# Patient Record
Sex: Female | Born: 1950 | Race: White | Hispanic: No | Smoking: Current every day smoker
Health system: Southern US, Community
[De-identification: ages and names within clinical notes are randomized; demographics above are authoritative.]

## PROBLEM LIST (undated history)

## (undated) DIAGNOSIS — C50919 Malignant neoplasm of unspecified site of unspecified female breast: Secondary | ICD-10-CM

## (undated) DIAGNOSIS — Z923 Personal history of irradiation: Secondary | ICD-10-CM

## (undated) DIAGNOSIS — I10 Essential (primary) hypertension: Secondary | ICD-10-CM

## (undated) DIAGNOSIS — M1990 Unspecified osteoarthritis, unspecified site: Secondary | ICD-10-CM

## (undated) DIAGNOSIS — H269 Unspecified cataract: Secondary | ICD-10-CM

## (undated) HISTORY — DX: Unspecified cataract: H26.9

## (undated) HISTORY — PX: COLONOSCOPY: SHX174

## (undated) HISTORY — PX: WISDOM TOOTH EXTRACTION: SHX21

## (undated) HISTORY — PX: APPENDECTOMY: SHX54

## (undated) HISTORY — PX: TONSILLECTOMY: SUR1361

---

## 2014-05-16 HISTORY — PX: BREAST LUMPECTOMY: SHX2

## 2018-02-06 ENCOUNTER — Other Ambulatory Visit: Payer: Self-pay | Admitting: Internal Medicine

## 2018-02-06 DIAGNOSIS — R922 Inconclusive mammogram: Secondary | ICD-10-CM

## 2018-02-14 ENCOUNTER — Ambulatory Visit: Payer: Self-pay

## 2018-02-14 ENCOUNTER — Ambulatory Visit
Admission: RE | Admit: 2018-02-14 | Discharge: 2018-02-14 | Disposition: A | Discharge status: Home / Self Care | Payer: Medicare Other | Source: Ambulatory Visit | Attending: Internal Medicine | Admitting: Internal Medicine

## 2018-02-14 DIAGNOSIS — R922 Inconclusive mammogram: Secondary | ICD-10-CM

## 2018-02-14 HISTORY — DX: Personal history of irradiation: Z92.3

## 2019-02-06 ENCOUNTER — Other Ambulatory Visit: Payer: Self-pay | Admitting: Internal Medicine

## 2019-02-06 DIAGNOSIS — Z9889 Other specified postprocedural states: Secondary | ICD-10-CM

## 2019-02-20 ENCOUNTER — Ambulatory Visit
Admission: RE | Admit: 2019-02-20 | Discharge: 2019-02-20 | Disposition: A | Discharge status: Home / Self Care | Payer: Medicare Other | Source: Ambulatory Visit | Attending: Internal Medicine | Admitting: Internal Medicine

## 2019-02-20 ENCOUNTER — Other Ambulatory Visit: Payer: Self-pay

## 2019-02-20 DIAGNOSIS — Z9889 Other specified postprocedural states: Secondary | ICD-10-CM

## 2020-01-14 ENCOUNTER — Other Ambulatory Visit: Payer: Self-pay | Admitting: Internal Medicine

## 2020-01-16 ENCOUNTER — Other Ambulatory Visit: Payer: Self-pay | Admitting: Internal Medicine

## 2020-01-16 DIAGNOSIS — R5381 Other malaise: Secondary | ICD-10-CM

## 2020-02-03 ENCOUNTER — Other Ambulatory Visit: Payer: Self-pay | Admitting: Internal Medicine

## 2020-02-07 ENCOUNTER — Other Ambulatory Visit: Payer: Self-pay | Admitting: Internal Medicine

## 2020-02-07 DIAGNOSIS — M8580 Other specified disorders of bone density and structure, unspecified site: Secondary | ICD-10-CM

## 2020-02-07 DIAGNOSIS — Z9889 Other specified postprocedural states: Secondary | ICD-10-CM

## 2020-02-24 NOTE — Progress Notes (Addendum)
COVID Vaccine Completed:  x2 Date COVID Vaccine completed: 06-22-19 & 07-13-19  COVID vaccine manufacturer: [place]   PCP - Merton Border, MD Cardiologist -   Chest x-ray -  EKG - 02-25-20 in Epic Stress Test -  ECHO -  Cardiac Cath -  Pacemaker/ICD device last checked:  Sleep Study -  CPAP -   Fasting Blood Sugar -  Checks Blood Sugar _____ times a day  Blood Thinner Instructions: Aspirin Instructions: Last Dose:  Anesthesia review:   Patient denies shortness of breath, fever, cough and chest pain at PAT appointment   Patient verbalized understanding of instructions that were given to them at the PAT appointment. Patient was also instructed that they will need to review over the PAT instructions again at home before surgery.

## 2020-02-24 NOTE — Patient Instructions (Addendum)
DUE TO COVID-19 ONLY ONE VISITOR IS ALLOWED TO COME WITH YOU AND STAY IN THE WAITING ROOM ONLY  DURING PRE OP AND PROCEDURE.   IF YOU WILL BE ADMITTED INTO THE HOSPITAL YOU ARE ALLOWED ONE SUPPORT PERSON DURING  VISITATION HOURS ONLY (10AM -8PM)   . The support person may change daily. . The support person must pass our screening, gel in and out, and wear a mask at all times, including in the patient's room. . Patients must also wear a mask when staff or their support person are in the room.   COVID SWAB TESTING MUST BE COMPLETED ON:  Friday, 02-28-20 @ 2:45   [address] [place], [place] [address]  (Must self quarantine after testing. Follow instructions on  handout.)        Your procedure is scheduled on:  Tuesday, 03-03-20   Report to [facility] Main  Entrance   Report to admitting at 10:30 AM   Call this number if you have problems the morning of surgery [phone]   Do not eat food :After Midnight.   May have liquids until 9:55 AM day of surgery  CLEAR LIQUID DIET  Foods Allowed                                                                     Foods Excluded  Water, Black Coffee and tea, regular and decaf             liquids that you cannot  Plain Jell-O in any flavor  (No red)                                   see through such as: Fruit ices (not with fruit pulp)                                      milk, soups, orange juice              Iced Popsicles (No red)                                      All solid food                                   Apple juices Sports drinks like Gatorade (No red) Lightly seasoned clear broth or consume(fat free) Sugar, honey syrup    Complete one Ensure drink the morning of surgery at  9:55 AM  the day of surgery.   Oral Hygiene is also important to reduce your risk of infection.                                    Remember - BRUSH YOUR TEETH THE MORNING OF SURGERY WITH YOUR REGULAR TOOTHPASTE   Do NOT smoke after  Midnight   Take these medicines the morning of  surgery with A SIP OF WATER:  None                                You may not have any metal on your body including hair pins, jewelry, and body piercings             Do not wear make-up, lotions, powders, perfumes/cologne, or deodorant             Do not wear nail polish.  Do not shave  48 hours prior to surgery.    Do not bring valuables to the hospital. [facility].   Contacts, dentures or bridgework may not be worn into surgery.     Patients discharged the day of surgery will not be allowed to drive home.                Please read over the following fact sheets you were given: IF YOU HAVE QUESTIONS ABOUT YOUR PRE OP INSTRUCTIONS PLEASE CALL [phone]   [place] - Preparing for Surgery Before surgery, you can play an important role.  Because skin is not sterile, your skin needs to be as free of germs as possible.  You can reduce the number of germs on your skin by washing with CHG (chlorahexidine gluconate) soap before surgery.  CHG is an antiseptic cleaner which kills germs and bonds with the skin to continue killing germs even after washing. Please DO NOT use if you have an allergy to CHG or antibacterial soaps.  If your skin becomes reddened/irritated stop using the CHG and inform your nurse when you arrive at Short Stay. Do not shave (including legs and underarms) for at least 48 hours prior to the first CHG shower.  You may shave your face/neck.  Please follow these instructions carefully:  1.  Shower with CHG Soap the night before surgery and the  morning of surgery.  2.  If you choose to wash your hair, wash your hair first as usual with your normal  shampoo.  3.  After you shampoo, rinse your hair and body thoroughly to remove the shampoo.                             4.  Use CHG as you would any other liquid soap.  You can apply chg directly to the skin and wash.  Gently with a scrungie  or clean washcloth.  5.  Apply the CHG Soap to your body ONLY FROM THE NECK DOWN.   Do   not use on face/ open                           Wound or open sores. Avoid contact with eyes, ears mouth and   genitals (private parts).                       Wash face,  Genitals (private parts) with your normal soap.             6.  Wash thoroughly, paying special attention to the area where your    surgery  will be performed.  7.  Thoroughly rinse your body with warm water from the neck down.  8.  DO NOT shower/wash with your normal soap after using and rinsing  off the CHG Soap.                9.  Pat yourself dry with a clean towel.            10.  Wear clean pajamas.            11.  Place clean sheets on your bed the night of your first shower and do not  sleep with pets. Day of Surgery : Do not apply any lotions/deodorants the morning of surgery.  Please wear clean clothes to the hospital/surgery center.  FAILURE TO FOLLOW THESE INSTRUCTIONS MAY RESULT IN THE CANCELLATION OF YOUR SURGERY  PATIENT SIGNATURE_________________________________  NURSE SIGNATURE__________________________________  ________________________________________________________________________   Charlotte Blackwell  An incentive spirometer is a tool that can help keep your lungs clear and active. This tool measures how well you are filling your lungs with each breath. Taking long deep breaths may help reverse or decrease the chance of developing breathing (pulmonary) problems (especially infection) following:  A long period of time when you are unable to move or be active. BEFORE THE PROCEDURE   If the spirometer includes an indicator to show your best effort, your nurse or respiratory therapist will set it to a desired goal.  If possible, sit up straight or lean slightly forward. Try not to slouch.  Hold the incentive spirometer in an upright position. INSTRUCTIONS FOR USE  1. Sit on the edge of your bed if possible,  or sit up as far as you can in bed or on a chair. 2. Hold the incentive spirometer in an upright position. 3. Breathe out normally. 4. Place the mouthpiece in your mouth and seal your lips tightly around it. 5. Breathe in slowly and as deeply as possible, raising the piston or the ball toward the top of the column. 6. Hold your breath for 3-5 seconds or for as long as possible. Allow the piston or ball to fall to the bottom of the column. 7. Remove the mouthpiece from your mouth and breathe out normally. 8. Rest for a few seconds and repeat Steps 1 through 7 at least 10 times every 1-2 hours when you are awake. Take your time and take a few normal breaths between deep breaths. 9. The spirometer may include an indicator to show your best effort. Use the indicator as a goal to work toward during each repetition. 10. After each set of 10 deep breaths, practice coughing to be sure your lungs are clear. If you have an incision (the cut made at the time of surgery), support your incision when coughing by placing a pillow or rolled up towels firmly against it. Once you are able to get out of bed, walk around indoors and cough well. You may stop using the incentive spirometer when instructed by your caregiver.  RISKS AND COMPLICATIONS  Take your time so you do not get dizzy or light-headed.  If you are in pain, you may need to take or ask for pain medication before doing incentive spirometry. It is harder to take a deep breath if you are having pain. AFTER USE  Rest and breathe slowly and easily.  It can be helpful to keep track of a log of your progress. Your caregiver can provide you with a simple table to help with this. If you are using the spirometer at home, follow these instructions: [place] IF:   You are having difficultly using the spirometer.  You have trouble using the spirometer  as often as instructed.  Your pain medication is not giving enough relief while using the  spirometer.  You develop fever of 100.5 F (38.1 C) or higher. SEEK IMMEDIATE MEDICAL CARE IF:   You cough up bloody sputum that had not been present before.  You develop fever of 102 F (38.9 C) or greater.  You develop worsening pain at or near the incision site. MAKE SURE YOU:   Understand these instructions.  Will watch your condition.  Will get help right away if you are not doing well or get worse. Document Released: 09/12/2006 Document Revised: 07/25/2011 Document Reviewed: 11/13/2006 Exit[employer] Patient Information 2014 [employer], [place].   ________________________________________________________________________  WHAT IS A BLOOD TRANSFUSION? Blood Transfusion Information  A transfusion is the replacement of blood or some of its parts. Blood is made up of multiple cells which provide different functions.  Red blood cells carry oxygen and are used for blood loss replacement.  White blood cells fight against infection.  Platelets control bleeding.  Plasma helps clot blood.  Other blood products are available for specialized needs, such as hemophilia or other clotting disorders. BEFORE THE TRANSFUSION  Who gives blood for transfusions?   Healthy volunteers who are fully evaluated to make sure their blood is safe. This is blood bank blood. Transfusion therapy is the safest it has ever been in the practice of medicine. Before blood is taken from a donor, a complete history is taken to make sure that person has no history of diseases nor engages in risky social behavior (examples are intravenous drug use or sexual activity with multiple partners). The donor's travel history is screened to minimize risk of transmitting infections, such as malaria. The donated blood is tested for signs of infectious diseases, such as HIV and hepatitis. The blood is then tested to be sure it is compatible with you in order to minimize the chance of a transfusion reaction. If you or a relative  donates blood, this is often done in anticipation of surgery and is not appropriate for emergency situations. It takes many days to process the donated blood. RISKS AND COMPLICATIONS Although transfusion therapy is very safe and saves many lives, the main dangers of transfusion include:   Getting an infectious disease.  Developing a transfusion reaction. This is an allergic reaction to something in the blood you were given. Every precaution is taken to prevent this. The decision to have a blood transfusion has been considered carefully by your caregiver before blood is given. Blood is not given unless the benefits outweigh the risks. AFTER THE TRANSFUSION  Right after receiving a blood transfusion, you will usually feel much better and more energetic. This is especially true if your red blood cells have gotten low (anemic). The transfusion raises the level of the red blood cells which carry oxygen, and this usually causes an energy increase.  The nurse administering the transfusion will monitor you carefully for complications. HOME CARE INSTRUCTIONS  No special instructions are needed after a transfusion. You may find your energy is better. Speak with your caregiver about any limitations on activity for underlying diseases you may have. SEEK MEDICAL CARE IF:   Your condition is not improving after your transfusion.  You develop redness or irritation at the intravenous (IV) site. SEEK IMMEDIATE MEDICAL CARE IF:  Any of the following symptoms occur over the next 12 hours:  Shaking chills.  You have a temperature by mouth above 102 F (38.9 C), not controlled by medicine.  Chest, back,  or muscle pain.  People around you feel you are not acting correctly or are confused.  Shortness of breath or difficulty breathing.  Dizziness and fainting.  You get a rash or develop hives.  You have a decrease in urine output.  Your urine turns a dark color or changes to pink, red, or brown. Any  of the following symptoms occur over the next 10 days:  You have a temperature by mouth above 102 F (38.9 C), not controlled by medicine.  Shortness of breath.  Weakness after normal activity.  The white part of the eye turns yellow (jaundice).  You have a decrease in the amount of urine or are urinating less often.  Your urine turns a dark color or changes to pink, red, or brown. Document Released: 04/29/2000 Document Revised: 07/25/2011 Document Reviewed: 12/17/2007 [facility] Patient Information 2014 [place], [place].  _______________________________________________________________________

## 2020-02-25 ENCOUNTER — Encounter (HOSPITAL_COMMUNITY)
Admission: RE | Admit: 2020-02-25 | Discharge: 2020-02-25 | Disposition: A | Discharge status: Home / Self Care | Payer: Medicare Other | Source: Ambulatory Visit | Attending: Orthopedic Surgery | Admitting: Orthopedic Surgery

## 2020-02-25 ENCOUNTER — Other Ambulatory Visit: Payer: Self-pay

## 2020-02-25 ENCOUNTER — Encounter (HOSPITAL_COMMUNITY): Payer: Self-pay

## 2020-02-25 DIAGNOSIS — Z01818 Encounter for other preprocedural examination: Secondary | ICD-10-CM | POA: Diagnosis present

## 2020-02-25 DIAGNOSIS — I10 Essential (primary) hypertension: Secondary | ICD-10-CM | POA: Diagnosis not present

## 2020-02-25 HISTORY — DX: Unspecified osteoarthritis, unspecified site: M19.90

## 2020-02-25 HISTORY — DX: Essential (primary) hypertension: I10

## 2020-02-25 HISTORY — DX: Malignant neoplasm of unspecified site of unspecified female breast: C50.919

## 2020-02-25 LAB — CBC
HCT: 42.2 % (ref 36.0–46.0)
Hemoglobin: 14.3 g/dL (ref 12.0–15.0)
MCH: 31.3 pg (ref 26.0–34.0)
MCHC: 33.9 g/dL (ref 30.0–36.0)
MCV: 92.3 fL (ref 80.0–100.0)
Platelets: 249 10*3/uL (ref 150–400)
RBC: 4.57 MIL/uL (ref 3.87–5.11)
RDW: 12.7 % (ref 11.5–15.5)
WBC: 9.4 10*3/uL (ref 4.0–10.5)
nRBC: 0 % (ref 0.0–0.2)

## 2020-02-25 LAB — BASIC METABOLIC PANEL
Anion gap: 10 (ref 5–15)
BUN: 10 mg/dL (ref 8–23)
CO2: 25 mmol/L (ref 22–32)
Calcium: 9.7 mg/dL (ref 8.9–10.3)
Chloride: 102 mmol/L (ref 98–111)
Creatinine, Ser: 0.51 mg/dL (ref 0.44–1.00)
GFR, Estimated: >60 mL/min (ref >60)
Glucose, Bld: 95 mg/dL (ref 70–99)
Potassium: 4 mmol/L (ref 3.5–5.1)
Sodium: 137 mmol/L (ref 135–145)

## 2020-02-25 LAB — SURGICAL PCR SCREEN
MRSA, PCR: NEGATIVE
Staphylococcus aureus: NEGATIVE

## 2020-02-26 ENCOUNTER — Ambulatory Visit
Admission: RE | Admit: 2020-02-26 | Discharge: 2020-02-26 | Disposition: A | Discharge status: Home / Self Care | Payer: Medicare Other | Source: Ambulatory Visit | Attending: Internal Medicine | Admitting: Internal Medicine

## 2020-02-26 DIAGNOSIS — Z9889 Other specified postprocedural states: Secondary | ICD-10-CM

## 2020-02-28 ENCOUNTER — Other Ambulatory Visit (HOSPITAL_COMMUNITY)
Admission: RE | Admit: 2020-02-28 | Discharge: 2020-02-28 | Disposition: A | Discharge status: Home / Self Care | Payer: Medicare Other | Source: Ambulatory Visit | Attending: Orthopedic Surgery | Admitting: Orthopedic Surgery

## 2020-02-28 DIAGNOSIS — Z20822 Contact with and (suspected) exposure to covid-19: Secondary | ICD-10-CM | POA: Insufficient documentation

## 2020-02-28 DIAGNOSIS — Z01812 Encounter for preprocedural laboratory examination: Secondary | ICD-10-CM | POA: Insufficient documentation

## 2020-02-29 LAB — SARS CORONAVIRUS 2 (TAT 6-24 HRS): SARS Coronavirus 2: NEGATIVE

## 2020-03-01 NOTE — H&P (Signed)
TOTAL HIP ADMISSION H&P  Patient is admitted for right total hip arthroplasty, anterior approach.  Subjective:  Chief Complaint: Right hip OA / pain  HPI: Jinan Biggins Wangerin, 69 y.o. female, has a history of pain and functional disability in the right hip(s) due to arthritis and patient has failed non-surgical conservative treatments for greater than 12 weeks to include NSAID's and/or analgesics, use of assistive devices and activity modification.  Onset of symptoms was gradual starting 10 years ago with gradually worsening course since that time.The patient noted no past surgery on the right hip(s).  Patient currently rates pain in the right hip at 10 out of 10 with activity. Patient has night pain, worsening of pain with activity and weight bearing, trendelenberg gait, pain that interfers with activities of daily living and pain with passive range of motion. Patient has evidence of periarticular osteophytes and joint space narrowing by imaging studies. This condition presents safety issues increasing the risk of falls.There is no current active infection.   Risks, benefits and expectations were discussed with the patient.  Risks including but not limited to the risk of anesthesia, blood clots, nerve damage, blood vessel damage, failure of the prosthesis, infection and up to and including death.  Patient understand the risks, benefits and expectations and wishes to proceed with surgery.   PCP: Wayland Denis, MD  D/C Plans:       Home (SD)  Post-op Meds:       No Rx given / Rx given for ASA, Robaxin, Norco, Iron, Colace and MiraLax  Tranexamic Acid:      To be given - IV   Decadron:      Is to be given  FYI:      ASA  Norco  DME:   Pt already has equipment   PT:   [facility]  Pharmacy: [facility]    Past Medical History:  Diagnosis Date  . Arthritis   . Breast cancer ([place])    Left breast  . Hypertension   . Personal history of radiation therapy     Past Surgical  History:  Procedure Laterality Date  . APPENDECTOMY    . BREAST LUMPECTOMY Left 2016  . COLONOSCOPY    . TONSILLECTOMY    . WISDOM TOOTH EXTRACTION      No current facility-administered medications for this encounter.   Current Outpatient Medications  Medication Sig Dispense Refill Last Dose  . acetaminophen (TYLENOL) 650 MG CR tablet Take 1,300 mg by mouth in the morning and at bedtime.     Marland Kitchen lisinopril (ZESTRIL) 20 MG tablet Take 20 mg by mouth at bedtime.      . Multiple Vitamins-Minerals (MULTIVITAMIN WITH MINERALS) tablet Take 1 tablet by mouth daily.     . traMADol (ULTRAM) 50 MG tablet Take 50 mg by mouth every 6 (six) hours as needed for moderate pain.      Marland Kitchen zolpidem (AMBIEN) 10 MG tablet Take 10 mg by mouth at bedtime.      No Known Allergies   Social History   Tobacco Use  . Smoking status: Current Every Day Smoker    Packs/day: 1.00  . Smokeless tobacco: Never Used  . Tobacco comment: Starting smoking at age 53  Substance Use Topics  . Alcohol use: Yes    Comment: very rare       Review of Systems  Constitutional: Negative.   HENT: Negative.   Eyes: Negative.   Respiratory: Negative.   Cardiovascular: Negative.  Gastrointestinal: Negative.   Genitourinary: Negative.   Musculoskeletal: Positive for joint pain.  Skin: Negative.   Neurological: Negative.   Endo/Heme/Allergies: Negative.   Psychiatric/Behavioral: The patient has insomnia.       Objective:  Physical Exam Constitutional:      Appearance: She is well-developed.  HENT:     Head: Normocephalic.  Eyes:     Pupils: Pupils are equal, round, and reactive to light.  Neck:     Thyroid: No thyromegaly.     Vascular: No JVD.     Trachea: No tracheal deviation.  Cardiovascular:     Rate and Rhythm: Normal rate and regular rhythm.  Pulmonary:     Effort: Pulmonary effort is normal. No respiratory distress.     Breath sounds: Normal breath sounds. No wheezing.  Abdominal:     Palpations:  Abdomen is soft.     Tenderness: There is no abdominal tenderness. There is no guarding.  Musculoskeletal:     Cervical back: Neck supple.     Right hip: Tenderness and bony tenderness present. No deformity. Decreased range of motion. Decreased strength.  Lymphadenopathy:     Cervical: No cervical adenopathy.  Skin:    General: Skin is warm and dry.  Neurological:     Mental Status: She is alert and oriented to person, place, and time.       Labs:  Estimated body mass index is 21.07 kg/m as calculated from the following:   Height as of 02/25/20: 5\' 6"  (1.676 m).   Weight as of 02/25/20: 59.2 kg.   Imaging Review Plain radiographs demonstrate severe degenerative joint disease of the right hip(s). The bone quality appears to be good for age and reported activity level.      Assessment/Plan:  End stage arthritis, right hip  The patient history, physical examination, clinical judgement of the provider and imaging studies are consistent with end stage degenerative joint disease of the right hip(s) and total hip arthroplasty is deemed medically necessary. The treatment options including medical management, injection therapy, arthroscopy and arthroplasty were discussed at length. The risks and benefits of total hip arthroplasty were presented and reviewed. The risks due to aseptic loosening, infection, stiffness, dislocation/subluxation,  thromboembolic complications and other imponderables were discussed.  The patient acknowledged the explanation, agreed to proceed with the plan and consent was signed. Patient is being admitted for  treatment for surgery, pain control, PT, OT, prophylactic antibiotics, VTE prophylaxis, progressive ambulation and ADL's and discharge planning.The patient is planning to be discharged home.    [place] Pugh Milayah Krell   PA-C  03/01/2020, 3:06 PM

## 2020-03-03 ENCOUNTER — Encounter (HOSPITAL_COMMUNITY): Payer: Self-pay | Admitting: Orthopedic Surgery

## 2020-03-03 ENCOUNTER — Ambulatory Visit (HOSPITAL_COMMUNITY): Payer: Medicare Other

## 2020-03-03 ENCOUNTER — Other Ambulatory Visit: Payer: Self-pay

## 2020-03-03 ENCOUNTER — Ambulatory Visit (HOSPITAL_COMMUNITY): Payer: Medicare Other | Admitting: Certified Registered"

## 2020-03-03 ENCOUNTER — Encounter (HOSPITAL_COMMUNITY)
Admission: EM | Disposition: A | Discharge status: Home / Self Care | Payer: Self-pay | Source: Home / Self Care | Attending: Orthopedic Surgery

## 2020-03-03 ENCOUNTER — Observation Stay (HOSPITAL_COMMUNITY)
Admission: EM | Admit: 2020-03-03 | Discharge: 2020-03-04 | Disposition: A | Discharge status: Home / Self Care | Payer: Medicare Other | Attending: Orthopedic Surgery | Admitting: Orthopedic Surgery

## 2020-03-03 DIAGNOSIS — Z79899 Other long term (current) drug therapy: Secondary | ICD-10-CM | POA: Insufficient documentation

## 2020-03-03 DIAGNOSIS — F17210 Nicotine dependence, cigarettes, uncomplicated: Secondary | ICD-10-CM | POA: Diagnosis not present

## 2020-03-03 DIAGNOSIS — Z853 Personal history of malignant neoplasm of breast: Secondary | ICD-10-CM | POA: Insufficient documentation

## 2020-03-03 DIAGNOSIS — M1611 Unilateral primary osteoarthritis, right hip: Secondary | ICD-10-CM | POA: Diagnosis not present

## 2020-03-03 DIAGNOSIS — M25551 Pain in right hip: Secondary | ICD-10-CM | POA: Diagnosis present

## 2020-03-03 DIAGNOSIS — Z96649 Presence of unspecified artificial hip joint: Secondary | ICD-10-CM

## 2020-03-03 DIAGNOSIS — I10 Essential (primary) hypertension: Secondary | ICD-10-CM | POA: Insufficient documentation

## 2020-03-03 DIAGNOSIS — Z96641 Presence of right artificial hip joint: Secondary | ICD-10-CM

## 2020-03-03 DIAGNOSIS — Z419 Encounter for procedure for purposes other than remedying health state, unspecified: Secondary | ICD-10-CM

## 2020-03-03 HISTORY — PX: TOTAL HIP ARTHROPLASTY: SHX124

## 2020-03-03 LAB — TYPE AND SCREEN
ABO/RH(D): O POS
Antibody Screen: NEGATIVE

## 2020-03-03 LAB — ABO/RH: ABO/RH(D): O POS

## 2020-03-03 SURGERY — ARTHROPLASTY, HIP, TOTAL, ANTERIOR APPROACH
Anesthesia: Spinal | Site: Hip | Laterality: Right

## 2020-03-03 MED ORDER — ONDANSETRON HCL 4 MG PO TABS: 4.0000 mg | ORAL_TABLET | Freq: Four times a day (QID) | ORAL | Status: DC | PRN

## 2020-03-03 MED ORDER — METHOCARBAMOL 500 MG PO TABS: 500.0000 mg | ORAL_TABLET | Freq: Four times a day (QID) | ORAL | 0 refills | Status: DC | PRN

## 2020-03-03 MED ORDER — METOCLOPRAMIDE HCL 5 MG/ML IJ SOLN: 5.0000 mg | Freq: Three times a day (TID) | INTRAMUSCULAR | Status: DC | PRN

## 2020-03-03 MED ORDER — DEXAMETHASONE SODIUM PHOSPHATE 10 MG/ML IJ SOLN
10.0000 mg | Freq: Once | INTRAMUSCULAR | Status: DC
  Filled 2020-03-03: qty 1

## 2020-03-03 MED ORDER — CEFAZOLIN SODIUM-DEXTROSE 1-4 GM/50ML-% IV SOLN
1.0000 g | Freq: Four times a day (QID) | INTRAVENOUS | Status: AC
  Administered 2020-03-03 – 2020-03-04 (×2): 1 g via INTRAVENOUS
  Filled 2020-03-03: qty 50

## 2020-03-03 MED ORDER — PHENOL 1.4 % MT LIQD: 1.0000 | OROMUCOSAL | Status: DC | PRN

## 2020-03-03 MED ORDER — HYDROCODONE-ACETAMINOPHEN 7.5-325 MG PO TABS
1.0000 | ORAL_TABLET | ORAL | 0 refills | Status: DC | PRN
Start: 2020-03-03 — End: 2020-09-21

## 2020-03-03 MED ORDER — CEFAZOLIN SODIUM-DEXTROSE 1-4 GM/50ML-% IV SOLN
INTRAVENOUS | Status: AC
  Filled 2020-03-03: qty 50

## 2020-03-03 MED ORDER — POLYETHYLENE GLYCOL 3350 17 G PO PACK: 17.0000 g | PACK | Freq: Two times a day (BID) | ORAL | 0 refills | Status: DC

## 2020-03-03 MED ORDER — MENTHOL 3 MG MT LOZG: 1.0000 | LOZENGE | OROMUCOSAL | Status: DC | PRN

## 2020-03-03 MED ORDER — PROPOFOL 500 MG/50ML IV EMUL
INTRAVENOUS | Status: DC | PRN
  Administered 2020-03-03: 25 ug/kg/min via INTRAVENOUS

## 2020-03-03 MED ORDER — DEXAMETHASONE SODIUM PHOSPHATE 10 MG/ML IJ SOLN
10.0000 mg | Freq: Once | INTRAMUSCULAR | Status: AC
  Administered 2020-03-03: 10 mg via INTRAVENOUS

## 2020-03-03 MED ORDER — MIDAZOLAM HCL 5 MG/5ML IJ SOLN
INTRAMUSCULAR | Status: DC | PRN
  Administered 2020-03-03 (×2): 1 mg via INTRAVENOUS

## 2020-03-03 MED ORDER — ACETAMINOPHEN 325 MG PO TABS: 325.0000 mg | ORAL_TABLET | Freq: Four times a day (QID) | ORAL | Status: DC | PRN

## 2020-03-03 MED ORDER — OXYCODONE HCL 5 MG PO TABS: 5.0000 mg | ORAL_TABLET | Freq: Once | ORAL | Status: DC | PRN

## 2020-03-03 MED ORDER — MAGNESIUM CITRATE PO SOLN: 1.0000 | Freq: Once | ORAL | Status: DC | PRN

## 2020-03-03 MED ORDER — EPHEDRINE 5 MG/ML INJ
INTRAVENOUS | Status: AC
  Filled 2020-03-03: qty 10

## 2020-03-03 MED ORDER — METOCLOPRAMIDE HCL 5 MG PO TABS: 5.0000 mg | ORAL_TABLET | Freq: Three times a day (TID) | ORAL | Status: DC | PRN

## 2020-03-03 MED ORDER — TRANEXAMIC ACID-NACL 1000-0.7 MG/100ML-% IV SOLN
1000.0000 mg | Freq: Once | INTRAVENOUS | Status: AC
  Administered 2020-03-03: 1000 mg via INTRAVENOUS

## 2020-03-03 MED ORDER — LISINOPRIL 20 MG PO TABS
20.0000 mg | ORAL_TABLET | Freq: Every day | ORAL | Status: DC
  Administered 2020-03-03: 20 mg via ORAL
  Filled 2020-03-03: qty 1

## 2020-03-03 MED ORDER — HYDROCODONE-ACETAMINOPHEN 7.5-325 MG PO TABS
ORAL_TABLET | ORAL | Status: AC
Start: 2020-03-03 — End: 2020-03-04
  Filled 2020-03-03: qty 1

## 2020-03-03 MED ORDER — HYDROCODONE-ACETAMINOPHEN 5-325 MG PO TABS
1.0000 | ORAL_TABLET | ORAL | Status: DC | PRN
  Administered 2020-03-04: 2 via ORAL
  Filled 2020-03-03: qty 2

## 2020-03-03 MED ORDER — HYDROCODONE-ACETAMINOPHEN 7.5-325 MG PO TABS
1.0000 | ORAL_TABLET | ORAL | Status: DC | PRN
  Administered 2020-03-03: 1 via ORAL
  Filled 2020-03-03: qty 1

## 2020-03-03 MED ORDER — ASPIRIN 81 MG PO CHEW: 81.0000 mg | CHEWABLE_TABLET | Freq: Two times a day (BID) | ORAL | 0 refills | Status: AC

## 2020-03-03 MED ORDER — ONDANSETRON HCL 4 MG/2ML IJ SOLN
INTRAMUSCULAR | Status: AC
  Filled 2020-03-03: qty 2

## 2020-03-03 MED ORDER — FERROUS SULFATE 325 (65 FE) MG PO TABS: 325.0000 mg | ORAL_TABLET | Freq: Three times a day (TID) | ORAL | 0 refills | Status: DC

## 2020-03-03 MED ORDER — SODIUM CHLORIDE 0.9 % IV SOLN: INTRAVENOUS | Status: DC

## 2020-03-03 MED ORDER — AMISULPRIDE (ANTIEMETIC) 5 MG/2ML IV SOLN: 10.0000 mg | Freq: Once | INTRAVENOUS | Status: DC | PRN

## 2020-03-03 MED ORDER — CEFAZOLIN SODIUM-DEXTROSE 2-4 GM/100ML-% IV SOLN
2.0000 g | INTRAVENOUS | Status: AC
  Administered 2020-03-03: 2 g via INTRAVENOUS
  Filled 2020-03-03: qty 100

## 2020-03-03 MED ORDER — DOCUSATE SODIUM 100 MG PO CAPS
100.0000 mg | ORAL_CAPSULE | Freq: Two times a day (BID) | ORAL | Status: DC
  Administered 2020-03-03 – 2020-03-04 (×2): 100 mg via ORAL
  Filled 2020-03-03 (×2): qty 1

## 2020-03-03 MED ORDER — PHENYLEPHRINE 40 MCG/ML (10ML) SYRINGE FOR IV PUSH (FOR BLOOD PRESSURE SUPPORT)
PREFILLED_SYRINGE | INTRAVENOUS | Status: DC | PRN
  Administered 2020-03-03 (×2): 80 ug via INTRAVENOUS
  Administered 2020-03-03: 120 ug via INTRAVENOUS

## 2020-03-03 MED ORDER — DIPHENHYDRAMINE HCL 12.5 MG/5ML PO ELIX: 12.5000 mg | ORAL_SOLUTION | ORAL | Status: DC | PRN

## 2020-03-03 MED ORDER — BISACODYL 10 MG RE SUPP: 10.0000 mg | Freq: Every day | RECTAL | Status: DC | PRN

## 2020-03-03 MED ORDER — LACTATED RINGERS IV BOLUS: 250.0000 mL | Freq: Once | INTRAVENOUS | Status: DC

## 2020-03-03 MED ORDER — ONDANSETRON HCL 4 MG/2ML IJ SOLN: 4.0000 mg | Freq: Once | INTRAMUSCULAR | Status: DC | PRN

## 2020-03-03 MED ORDER — ORAL CARE MOUTH RINSE: 15.0000 mL | Freq: Once | OROMUCOSAL | Status: AC

## 2020-03-03 MED ORDER — DOCUSATE SODIUM 100 MG PO CAPS: 100.0000 mg | ORAL_CAPSULE | Freq: Two times a day (BID) | ORAL | 0 refills | Status: DC

## 2020-03-03 MED ORDER — LIDOCAINE 2% (20 MG/ML) 5 ML SYRINGE
INTRAMUSCULAR | Status: DC | PRN
  Administered 2020-03-03: 50 mg via INTRAVENOUS

## 2020-03-03 MED ORDER — FENTANYL CITRATE (PF) 100 MCG/2ML IJ SOLN
INTRAMUSCULAR | Status: DC | PRN
  Administered 2020-03-03 (×2): 25 ug via INTRAVENOUS

## 2020-03-03 MED ORDER — PHENYLEPHRINE 40 MCG/ML (10ML) SYRINGE FOR IV PUSH (FOR BLOOD PRESSURE SUPPORT)
PREFILLED_SYRINGE | INTRAVENOUS | Status: AC
  Filled 2020-03-03: qty 10

## 2020-03-03 MED ORDER — STERILE WATER FOR IRRIGATION IR SOLN
Status: DC | PRN
Start: 2020-03-03 — End: 2020-03-03
  Administered 2020-03-03: 2000 mL

## 2020-03-03 MED ORDER — GLYCOPYRROLATE PF 0.2 MG/ML IJ SOSY
PREFILLED_SYRINGE | INTRAMUSCULAR | Status: DC | PRN
  Administered 2020-03-03: .2 mg via INTRAVENOUS

## 2020-03-03 MED ORDER — POLYETHYLENE GLYCOL 3350 17 G PO PACK
17.0000 g | PACK | Freq: Two times a day (BID) | ORAL | Status: DC
  Administered 2020-03-03: 17 g via ORAL
  Filled 2020-03-03 (×2): qty 1

## 2020-03-03 MED ORDER — CELECOXIB 200 MG PO CAPS
200.0000 mg | ORAL_CAPSULE | Freq: Two times a day (BID) | ORAL | Status: DC
  Administered 2020-03-03 – 2020-03-04 (×2): 200 mg via ORAL
  Filled 2020-03-03 (×2): qty 1

## 2020-03-03 MED ORDER — LIDOCAINE 2% (20 MG/ML) 5 ML SYRINGE
INTRAMUSCULAR | Status: AC
  Filled 2020-03-03: qty 5

## 2020-03-03 MED ORDER — TRANEXAMIC ACID-NACL 1000-0.7 MG/100ML-% IV SOLN
INTRAVENOUS | Status: AC
  Filled 2020-03-03: qty 100

## 2020-03-03 MED ORDER — TRANEXAMIC ACID-NACL 1000-0.7 MG/100ML-% IV SOLN
1000.0000 mg | INTRAVENOUS | Status: AC
  Administered 2020-03-03: 1000 mg via INTRAVENOUS
  Filled 2020-03-03: qty 100

## 2020-03-03 MED ORDER — PROPOFOL 10 MG/ML IV BOLUS
INTRAVENOUS | Status: DC | PRN
  Administered 2020-03-03: 20 mg via INTRAVENOUS
  Administered 2020-03-03: 30 mg via INTRAVENOUS

## 2020-03-03 MED ORDER — LACTATED RINGERS IV SOLN: INTRAVENOUS | Status: DC

## 2020-03-03 MED ORDER — FENTANYL CITRATE (PF) 100 MCG/2ML IJ SOLN: 25.0000 ug | INTRAMUSCULAR | Status: DC | PRN

## 2020-03-03 MED ORDER — ZOLPIDEM TARTRATE 5 MG PO TABS
5.0000 mg | ORAL_TABLET | Freq: Every day | ORAL | Status: DC
  Administered 2020-03-03: 5 mg via ORAL
  Filled 2020-03-03: qty 1

## 2020-03-03 MED ORDER — ASPIRIN 81 MG PO CHEW
81.0000 mg | CHEWABLE_TABLET | Freq: Two times a day (BID) | ORAL | Status: DC
  Administered 2020-03-04: 81 mg via ORAL
  Filled 2020-03-03: qty 1

## 2020-03-03 MED ORDER — OXYCODONE HCL 5 MG/5ML PO SOLN: 5.0000 mg | Freq: Once | ORAL | Status: DC | PRN

## 2020-03-03 MED ORDER — FENTANYL CITRATE (PF) 100 MCG/2ML IJ SOLN
INTRAMUSCULAR | Status: AC
  Filled 2020-03-03: qty 2

## 2020-03-03 MED ORDER — BUPIVACAINE IN DEXTROSE 0.75-8.25 % IT SOLN
INTRATHECAL | Status: DC | PRN
  Administered 2020-03-03: 1.6 mL via INTRATHECAL

## 2020-03-03 MED ORDER — SODIUM CHLORIDE 0.9 % IR SOLN
Status: DC | PRN
  Administered 2020-03-03: 1000 mL

## 2020-03-03 MED ORDER — ALUM & MAG HYDROXIDE-SIMETH 200-200-20 MG/5ML PO SUSP: 15.0000 mL | ORAL | Status: DC | PRN

## 2020-03-03 MED ORDER — PHENYLEPHRINE HCL-NACL 10-0.9 MG/250ML-% IV SOLN
INTRAVENOUS | Status: DC | PRN
  Administered 2020-03-03: 25 ug/min via INTRAVENOUS

## 2020-03-03 MED ORDER — HYDROMORPHONE HCL 1 MG/ML IJ SOLN: 0.5000 mg | INTRAMUSCULAR | Status: DC | PRN

## 2020-03-03 MED ORDER — DEXAMETHASONE SODIUM PHOSPHATE 10 MG/ML IJ SOLN
INTRAMUSCULAR | Status: AC
  Filled 2020-03-03: qty 1

## 2020-03-03 MED ORDER — FERROUS SULFATE 325 (65 FE) MG PO TABS
325.0000 mg | ORAL_TABLET | Freq: Three times a day (TID) | ORAL | Status: DC
  Administered 2020-03-04: 325 mg via ORAL
  Filled 2020-03-03: qty 1

## 2020-03-03 MED ORDER — CHLORHEXIDINE GLUCONATE 0.12 % MT SOLN
15.0000 mL | Freq: Once | OROMUCOSAL | Status: AC
  Administered 2020-03-03: 15 mL via OROMUCOSAL

## 2020-03-03 MED ORDER — METHOCARBAMOL 500 MG IVPB - SIMPLE MED
500.0000 mg | Freq: Four times a day (QID) | INTRAVENOUS | Status: DC | PRN
  Filled 2020-03-03: qty 50

## 2020-03-03 MED ORDER — ONDANSETRON HCL 4 MG/2ML IJ SOLN: 4.0000 mg | Freq: Four times a day (QID) | INTRAMUSCULAR | Status: DC | PRN

## 2020-03-03 MED ORDER — METHOCARBAMOL 500 MG PO TABS
500.0000 mg | ORAL_TABLET | Freq: Four times a day (QID) | ORAL | Status: DC | PRN
  Administered 2020-03-04 (×2): 500 mg via ORAL
  Filled 2020-03-03 (×2): qty 1

## 2020-03-03 MED ORDER — MIDAZOLAM HCL 2 MG/2ML IJ SOLN: 1.0000 mg | INTRAMUSCULAR | Status: DC

## 2020-03-03 MED ORDER — LACTATED RINGERS IV BOLUS
500.0000 mL | Freq: Once | INTRAVENOUS | Status: AC
  Administered 2020-03-03: 500 mL via INTRAVENOUS

## 2020-03-03 SURGICAL SUPPLY — 47 items
BAG DECANTER FOR FLEXI CONT (MISCELLANEOUS) IMPLANT
BAG ZIPLOCK 12X15 (MISCELLANEOUS) IMPLANT
BLADE SAG 18X100X1.27 (BLADE) ×3 IMPLANT
BLADE SURG SZ10 CARB STEEL (BLADE) ×6 IMPLANT
COVER PERINEAL POST (MISCELLANEOUS) ×3 IMPLANT
COVER SURGICAL LIGHT HANDLE (MISCELLANEOUS) ×3 IMPLANT
COVER WAND RF STERILE (DRAPES) IMPLANT
CUP ACET PINNACLE SECTR 50MM (Hips) ×1 IMPLANT
DERMABOND ADVANCED (GAUZE/BANDAGES/DRESSINGS) ×2
DERMABOND ADVANCED .7 DNX12 (GAUZE/BANDAGES/DRESSINGS) ×1 IMPLANT
DRAPE STERI IOBAN 125X83 (DRAPES) ×3 IMPLANT
DRAPE U-SHAPE 47X51 STRL (DRAPES) ×6 IMPLANT
DRESSING AQUACEL AG SP 3.5X10 (GAUZE/BANDAGES/DRESSINGS) ×1 IMPLANT
DRSG AQUACEL AG SP 3.5X10 (GAUZE/BANDAGES/DRESSINGS) ×3
DURAPREP 26ML APPLICATOR (WOUND CARE) ×3 IMPLANT
ELECT REM PT RETURN 15FT ADLT (MISCELLANEOUS) ×3 IMPLANT
ELIMINATOR HOLE APEX DEPUY (Hips) ×3 IMPLANT
GLOVE BIO SURGEON STRL SZ 6 (GLOVE) ×6 IMPLANT
GLOVE BIOGEL PI IND STRL 6.5 (GLOVE) ×1 IMPLANT
GLOVE BIOGEL PI IND STRL 7.5 (GLOVE) ×1 IMPLANT
GLOVE BIOGEL PI IND STRL 8.5 (GLOVE) ×1 IMPLANT
GLOVE BIOGEL PI INDICATOR 6.5 (GLOVE) ×2
GLOVE BIOGEL PI INDICATOR 7.5 (GLOVE) ×2
GLOVE BIOGEL PI INDICATOR 8.5 (GLOVE) ×2
GLOVE ECLIPSE 8.0 STRL XLNG CF (GLOVE) ×6 IMPLANT
GLOVE ORTHO TXT STRL SZ7.5 (GLOVE) ×6 IMPLANT
GOWN STRL REUS W/TWL LRG LVL3 (GOWN DISPOSABLE) ×6 IMPLANT
GOWN STRL REUS W/TWL XL LVL3 (GOWN DISPOSABLE) ×3 IMPLANT
HEAD FEMORAL 32 CERAMIC (Hips) ×3 IMPLANT
HOLDER FOLEY CATH W/STRAP (MISCELLANEOUS) ×3 IMPLANT
KIT TURNOVER KIT A (KITS) ×3 IMPLANT
LINER ACET PNNCL PLUS4 NEUTRAL (Hips) ×1 IMPLANT
PACK ANTERIOR HIP CUSTOM (KITS) ×3 IMPLANT
PENCIL SMOKE EVACUATOR (MISCELLANEOUS) ×3 IMPLANT
PINNACLE PLUS 4 NEUTRAL (Hips) ×3 IMPLANT
PINNACLE SECTOR CUP 50MM (Hips) ×3 IMPLANT
SCREW 6.5MMX30MM (Screw) ×3 IMPLANT
STEM FEM ACTIS HIGH SZ3 (Stem) ×3 IMPLANT
SUT MNCRL AB 4-0 PS2 18 (SUTURE) ×3 IMPLANT
SUT STRATAFIX 0 PDS 27 VIOLET (SUTURE) ×3
SUT VIC AB 1 CT1 36 (SUTURE) ×9 IMPLANT
SUT VIC AB 2-0 CT1 27 (SUTURE) ×4
SUT VIC AB 2-0 CT1 TAPERPNT 27 (SUTURE) ×2 IMPLANT
SUTURE STRATFX 0 PDS 27 VIOLET (SUTURE) ×1 IMPLANT
TRAY FOLEY MTR SLVR 14FR STAT (SET/KITS/TRAYS/PACK) ×3 IMPLANT
TRAY FOLEY MTR SLVR 16FR STAT (SET/KITS/TRAYS/PACK) IMPLANT
WATER STERILE IRR 1000ML POUR (IV SOLUTION) ×6 IMPLANT

## 2020-03-03 NOTE — Evaluation (Signed)
Physical Therapy Evaluation Patient Details Name: Charlotte Blackwell MRN: [id number] DOB: January 07, 1951 Today's Date: 03/03/2020   History of Present Illness  Patient is 69 y.o. female s/p Rt THA anterior approach on 03/03/20 with PMH significant for HTN, OA, Breast Cancer.  Clinical Impression  Charlotte Blackwell is a 69 y.o. female POD 0 s/p Rt THA. Patient reports independence with mobility at baseline. Patient is now limited by functional impairments (see PT problem list below) and requires Min-Mod assist for transfers and gait with RW. Patient was limited by proximal hip weakness likely related to spinal block and required min-mod assist to steady in standing and with several small steps. She was unable to advance gait to greater distance this session due to weakness and balance impairments. Patient will benefit from continued skilled PT interventions to address impairments and progress towards PLOF. Acute PT will follow to progress mobility and stair training in preparation for safe discharge home.     Follow Up Recommendations Follow surgeon's recommendation for DC plan and follow-up therapies    Equipment Recommendations  Rolling walker with 5" wheels    Recommendations for Other Services       Precautions / Restrictions Precautions Precautions: Fall Restrictions Weight Bearing Restrictions: No Other Position/Activity Restrictions: WBAT      Mobility  Bed Mobility Overal bed mobility: Needs Assistance Bed Mobility: Supine to Sit;Sit to Supine     Supine to sit: Min guard;Supervision Sit to supine: Supervision;Min guard      Transfers Overall transfer level: Needs assistance Equipment used: Rolling walker (2 wheeled) Transfers: Sit to/from Stand Sit to Stand: Min assist;Mod assist         General transfer comment: Min assist for power up and min-mod assist to steady in standing due to balance impairments secondary to hip weakness. suspect pt's spinal block not  fully worn off.   Ambulation/Gait Ambulation/Gait assistance: Min assist;Mod assist Gait Distance (Feet): 3 Feet Assistive device: Rolling walker (2 wheeled) Gait Pattern/deviations: Step-to pattern;Decreased stride length;Scissoring;Narrow base of support Gait velocity: decr   General Gait Details: pt with unsteady gait requiring min-mod assist to steady and prevent LOB. pt with very NBOS and scissoring steps Lt>Rt. Pt unable to safely advance gait beyond several small forward and backward steps at EOB.   Stairs            Wheelchair Mobility    Modified Rankin (Stroke Patients Only)       Balance Overall balance assessment: Needs assistance Sitting-balance support: Feet supported Sitting balance-Leahy Scale: Fair     Standing balance support: During functional activity;Bilateral upper extremity supported Standing balance-Leahy Scale: Poor Standing balance comment: reliant on external support                             Pertinent Vitals/Pain Pain Assessment: No/denies pain    Home Living Family/patient expects to be discharged to:: Private residence Living Arrangements: Alone Available Help at Discharge: Friend(s) Type of Home: House Home Access: Level entry     Home Layout: One level Home Equipment: Walker - 2 wheels;Grab bars - toilet;Grab bars - tub/shower;Shower seat - built in      Prior Function Level of Independence: Independent               Hand Dominance   Dominant Hand: Right    Extremity/Trunk Assessment   Upper Extremity Assessment Upper Extremity Assessment: Overall WFL for tasks assessed    Lower Extremity  Assessment Lower Extremity Assessment: Overall WFL for tasks assessed    Cervical / Trunk Assessment Cervical / Trunk Assessment: Normal  Communication   Communication: No difficulties  Cognition Arousal/Alertness: Awake/alert Behavior During Therapy: WFL for tasks assessed/performed Overall Cognitive  Status: Within Functional Limits for tasks assessed                                        General Comments      Exercises     Assessment/Plan    PT Assessment Patient needs continued PT services  PT Problem List Decreased strength;Decreased range of motion;Decreased activity tolerance;Decreased balance;Decreased knowledge of use of DME;Decreased knowledge of precautions;Decreased coordination;Decreased mobility       PT Treatment Interventions DME instruction;Gait training;Stair training;Functional mobility training;Therapeutic activities;Therapeutic exercise;Balance training;Patient/family education    PT Goals (Current goals can be found in the Care Plan section)  Acute Rehab PT Goals Patient Stated Goal: get back home to her dog PT Goal Formulation: With patient Time For Goal Achievement: 03/10/20 Potential to Achieve Goals: Good    Frequency 7X/week   Barriers to discharge        Co-evaluation               AM-PAC PT "6 Clicks" Mobility  Outcome Measure Help needed turning from your back to your side while in a flat bed without using bedrails?: A Little Help needed moving from lying on your back to sitting on the side of a flat bed without using bedrails?: A Little Help needed moving to and from a bed to a chair (including a wheelchair)?: A Lot Help needed standing up from a chair using your arms (e.g., wheelchair or bedside chair)?: A Lot Help needed to walk in hospital room?: A Lot Help needed climbing 3-5 steps with a railing? : Total 6 Click Score: 13    End of Session Equipment Utilized During Treatment: Gait belt Activity Tolerance: Patient tolerated treatment well Patient left: in bed;with call bell/phone within reach Nurse Communication: Mobility status PT Visit Diagnosis: Muscle weakness (generalized) (M62.81);Difficulty in walking, not elsewhere classified (R26.2)    Time: 6381-7711 PT Time Calculation (min) (ACUTE ONLY): 17  min   Charges:   PT Evaluation $PT Eval Low Complexity: 1 Low        Verner Mould, DPT Acute Rehabilitation Services  Office [phone] Pager [phone]  03/03/2020 8:11 PM

## 2020-03-03 NOTE — Anesthesia Preprocedure Evaluation (Signed)
Anesthesia Evaluation  Patient identified by MRN, date of birth, ID band Patient awake    Reviewed: Allergy & Precautions, NPO status , Patient's Chart, lab work & pertinent test results  History of Anesthesia Complications Negative for: history of anesthetic complications  Airway Mallampati: II  TM Distance: >3 FB Neck ROM: Full    Dental  (+) Teeth Intact   Pulmonary Current Smoker,    Pulmonary exam normal        Cardiovascular hypertension, Pt. on medications Normal cardiovascular exam     Neuro/Psych negative neurological ROS  negative psych ROS   GI/Hepatic negative GI ROS, Neg liver ROS,   Endo/Other  negative endocrine ROS  Renal/GU negative Renal ROS  negative genitourinary   Musculoskeletal  (+) Arthritis , Osteoarthritis,    Abdominal   Peds  Hematology negative hematology ROS (+)   Anesthesia Other Findings   Reproductive/Obstetrics                            Anesthesia Physical Anesthesia Plan  ASA: II  Anesthesia Plan: Spinal   Post-op Pain Management:    Induction:   PONV Risk Score and Plan: 2 and Propofol infusion, Treatment may vary due to age or medical condition, Ondansetron and TIVA  Airway Management Planned: Nasal Cannula and Simple Face Mask  Additional Equipment: None  Intra-op Plan:   Post-operative Plan:   Informed Consent: I have reviewed the patients History and Physical, chart, labs and discussed the procedure including the risks, benefits and alternatives for the proposed anesthesia with the patient or authorized representative who has indicated his/her understanding and acceptance.       Plan Discussed with:   Anesthesia Plan Comments:         Anesthesia Quick Evaluation

## 2020-03-03 NOTE — Interval H&P Note (Signed)
History and Physical Interval Note:  03/03/2020 12:53 PM  Charlotte Blackwell  has presented today for surgery, with the diagnosis of Right hip osteoarthritis.  The various methods of treatment have been discussed with the patient and family. After consideration of risks, benefits and other options for treatment, the patient has consented to  Procedure(s) with comments: TOTAL HIP ARTHROPLASTY ANTERIOR APPROACH (Right) - 70 mins as a surgical intervention.  The patient's history has been reviewed, patient examined, no change in status, stable for surgery.  I have reviewed the patient's chart and labs.  Questions were answered to the patient's satisfaction.     Mauri Pole

## 2020-03-03 NOTE — Op Note (Signed)
NAME:  Wilhelmena Zea Sigley                ACCOUNT NO.: [id number]      MEDICAL RECORD NO.: [id number]      FACILITY:  [facility]      PHYSICIAN:  Mauri Pole  DATE OF BIRTH:  [birth year]     DATE OF PROCEDURE:  03/03/2020                                 OPERATIVE REPORT         PREOPERATIVE DIAGNOSIS: Right  hip osteoarthritis.      POSTOPERATIVE DIAGNOSIS:  Right hip osteoarthritis.      PROCEDURE:  Right total hip replacement through an anterior approach   utilizing DePuy THR system, component size 50 mm pinnacle cup, a size 32+4 neutral   Altrex liner, a size 3 Hi Actis stem with a 32+1 delta ceramic   ball.      SURGEON:  Pietro Cassis. Alvan Dame, M.D.      ASSISTANT:  Danae Orleans, PA-C     ANESTHESIA:  Spinal.      SPECIMENS:  None.      COMPLICATIONS:  None.      BLOOD LOSS:  200 cc     DRAINS:  None.      INDICATION OF THE PROCEDURE:  Charlotte Blackwell is a 69 y.o. female who had   presented to office for evaluation of right hip pain.  Radiographs revealed   progressive degenerative changes with bone-on-bone   articulation of the  hip joint, including subchondral cystic changes and osteophytes.  The patient had painful limited range of   motion significantly affecting their overall quality of life and function.  The patient was failing to    respond to conservative measures including medications and/or injections and activity modification and at this point was ready   to proceed with more definitive measures.  Consent was obtained for   benefit of pain relief.  Specific risks of infection, DVT, component   failure, dislocation, neurovascular injury, and need for revision surgery were reviewed in the office as well discussion of   the anterior versus posterior approach were reviewed.     PROCEDURE IN DETAIL:  The patient was brought to operative theater.   Once adequate anesthesia, preoperative antibiotics, 2 gm of Ancef, 1 gm of Tranexamic  Acid, and 10 mg of Decadron were administered, the patient was positioned supine on the [employer] table.  Once the patient was safely positioned with adequate padding of boney prominences we predraped out the hip, and used fluoroscopy to confirm orientation of the pelvis.      The right hip was then prepped and draped from proximal iliac crest to   mid thigh with a shower curtain technique.      Time-out was performed identifying the patient, planned procedure, and the appropriate extremity.     An incision was then made 2 cm lateral to the   anterior superior iliac spine extending over the orientation of the   tensor fascia lata muscle and sharp dissection was carried down to the   fascia of the muscle.      The fascia was then incised.  The muscle belly was identified and swept   laterally and retractor placed along the superior neck.  Following   cauterization of the circumflex vessels and removing some  pericapsular   fat, a second cobra retractor was placed on the inferior neck.  A T-capsulotomy was made along the line of the   superior neck to the trochanteric fossa, then extended proximally and   distally.  Tag sutures were placed and the retractors were then placed   intracapsular.  We then identified the trochanteric fossa and   orientation of my neck cut and then made a neck osteotomy with the femur on traction.  The femoral   head was removed without difficulty or complication.  Traction was let   off and retractors were placed posterior and anterior around the   acetabulum.      The labrum and foveal tissue were debrided.  I began reaming with a 44 mm   reamer and reamed up to 49 mm reamer with good bony bed preparation and a 50 mm  cup was chosen.  The final 50 mm Pinnacle cup was then impacted under fluoroscopy to confirm the depth of penetration and orientation with respect to   Abduction and forward flexion.  A screw was placed into the ilium followed by the hole eliminator.   The final   32+4 neutral Altrex liner was impacted with good visualized rim fit.  The cup was positioned anatomically within the acetabular portion of the pelvis.      At this point, the femur was rolled to 100 degrees.  Further capsule was   released off the inferior aspect of the femoral neck.  I then   released the superior capsule proximally.  With the leg in a neutral position the hook was placed laterally   along the femur under the vastus lateralis origin and elevated manually and then held in position using the hook attachment on the bed.  The leg was then extended and adducted with the leg rolled to 100   degrees of external rotation.  Retractors were placed along the medial calcar and posteriorly over the greater trochanter.  Once the proximal femur was fully   exposed, I used a box osteotome to set orientation.  I then began   broaching with the starting chili pepper broach and passed this by hand and then broached up to 3.  With the 3 broach in place I chose a high offset neck and did several trial reductions.  The offset was appropriate, leg lengths   appeared to be equal best matched with the +1 head ball trial confirmed radiographically.   Given these findings, I went ahead and dislocated the hip, repositioned all   retractors and positioned the right hip in the extended and abducted position.  The final 3 Hi Actis stem was   chosen and it was impacted down to the level of neck cut.  Based on this   and the trial reductions, a final 32+1 delta ceramic ball was chosen and   impacted onto a clean and dry trunnion, and the hip was reduced.  The   hip had been irrigated throughout the case again at this point.  I did   reapproximate the superior capsular leaflet to the anterior leaflet   using #1 Vicryl.  The fascia of the   tensor fascia lata muscle was then reapproximated using #1 Vicryl and #0 Stratafix sutures.  The   remaining wound was closed with 2-0 Vicryl and running 4-0  Monocryl.   The hip was cleaned, dried, and dressed sterilely using Dermabond and   Aquacel dressing.  The patient was then brought   to  recovery room in stable condition tolerating the procedure well.    Danae Orleans, PA-C was present for the entirety of the case involved from   preoperative positioning, perioperative retractor management, general   facilitation of the case, as well as primary wound closure as assistant.            Pietro Cassis Alvan Dame, M.D.        03/03/2020 1:01 PM

## 2020-03-03 NOTE — Anesthesia Procedure Notes (Signed)
Spinal  Patient location during procedure: OR Staffing Performed: anesthesiologist  Anesthesiologist: Lidia Collum, MD Preanesthetic Checklist Completed: patient identified, IV checked, risks and benefits discussed, surgical consent, monitors and equipment checked, pre-op evaluation and timeout performed Spinal Block Patient position: sitting Prep: DuraPrep and site prepped and draped Patient monitoring: continuous pulse ox, blood pressure and heart rate Approach: midline Location: L3-4 Injection technique: single-shot Needle Needle type: Quincke  Needle gauge: 22 G Needle length: 9 cm Additional Notes Functioning IV was confirmed and monitors were applied. Sterile prep and drape, including hand hygiene and sterile gloves were used. The patient was positioned and the spine was prepped. The skin was anesthetized with lidocaine.  Free flow of clear CSF was obtained prior to injecting local anesthetic into the CSF. The needle was carefully withdrawn. The patient tolerated the procedure well.

## 2020-03-03 NOTE — Transfer of Care (Signed)
Immediate Anesthesia Transfer of Care Note  Patient: Charlotte Blackwell  Procedure(s) Performed: TOTAL HIP ARTHROPLASTY ANTERIOR APPROACH (Right Hip)  Patient Location: PACU  Anesthesia Type:MAC and Spinal  Level of Consciousness: awake, oriented, patient cooperative and responds to stimulation  Airway & Oxygen Therapy: Patient Spontanous Breathing and Patient connected to face mask oxygen  Post-op Assessment: Report given to RN and Post -op Vital signs reviewed and stable  Post vital signs: Reviewed and stable  Last Vitals:  Vitals Value Taken Time  BP 117/51 03/03/20 1541  Temp    Pulse 96 03/03/20 1543  Resp 13 03/03/20 1543  SpO2 97 % 03/03/20 1543  Vitals shown include unvalidated device data.  Last Pain:  Vitals:   03/03/20 1152  TempSrc: Oral      Patients Stated Pain Goal: 4 (37/44/51 4604)  Complications: No complications documented.

## 2020-03-03 NOTE — Anesthesia Postprocedure Evaluation (Signed)
Anesthesia Post Note  Patient: Charlotte Blackwell  Procedure(s) Performed: TOTAL HIP ARTHROPLASTY ANTERIOR APPROACH (Right Hip)     Patient location during evaluation: PACU Anesthesia Type: Spinal Level of consciousness: oriented and awake and alert Pain management: pain level controlled Vital Signs Assessment: post-procedure vital signs reviewed and stable Respiratory status: spontaneous breathing, respiratory function stable and nonlabored ventilation Cardiovascular status: blood pressure returned to baseline and stable Postop Assessment: no headache, no backache, no apparent nausea or vomiting and spinal receding Anesthetic complications: no   No complications documented.  Last Vitals:  Vitals:   03/03/20 1645 03/03/20 1700  BP: 139/79 (!) 148/65  Pulse: 87 75  Resp: 17 17  Temp: 36.7 C 36.7 C  SpO2: 98% 100%    Last Pain:  Vitals:   03/03/20 1700  TempSrc:   PainSc: 3     LLE Motor Response: Purposeful movement (03/03/20 1700) LLE Sensation: Decreased;Numbness (03/03/20 1700) RLE Motor Response: Purposeful movement (03/03/20 1700) RLE Sensation: Decreased;Numbness (03/03/20 1700) L Sensory Level: L5-Outer lower leg, top of foot, great toe (03/03/20 1700) R Sensory Level: L5-Outer lower leg, top of foot, great toe (03/03/20 1700)  Lidia Collum

## 2020-03-03 NOTE — Discharge Instructions (Signed)

## 2020-03-04 ENCOUNTER — Encounter (HOSPITAL_COMMUNITY): Payer: Self-pay | Admitting: Orthopedic Surgery

## 2020-03-04 DIAGNOSIS — M1611 Unilateral primary osteoarthritis, right hip: Secondary | ICD-10-CM | POA: Diagnosis not present

## 2020-03-04 LAB — CBC
HCT: 32.8 % — ABNORMAL LOW (ref 36.0–46.0)
Hemoglobin: 11.1 g/dL — ABNORMAL LOW (ref 12.0–15.0)
MCH: 32 pg (ref 26.0–34.0)
MCHC: 33.8 g/dL (ref 30.0–36.0)
MCV: 94.5 fL (ref 80.0–100.0)
Platelets: 177 10*3/uL (ref 150–400)
RBC: 3.47 MIL/uL — ABNORMAL LOW (ref 3.87–5.11)
RDW: 12.6 % (ref 11.5–15.5)
WBC: 9.7 10*3/uL (ref 4.0–10.5)
nRBC: 0 % (ref 0.0–0.2)

## 2020-03-04 LAB — BASIC METABOLIC PANEL
Anion gap: 9 (ref 5–15)
BUN: 10 mg/dL (ref 8–23)
CO2: 24 mmol/L (ref 22–32)
Calcium: 8.4 mg/dL — ABNORMAL LOW (ref 8.9–10.3)
Chloride: 102 mmol/L (ref 98–111)
Creatinine, Ser: 0.48 mg/dL (ref 0.44–1.00)
GFR, Estimated: >60 mL/min (ref >60)
Glucose, Bld: 146 mg/dL — ABNORMAL HIGH (ref 70–99)
Potassium: 3.7 mmol/L (ref 3.5–5.1)
Sodium: 135 mmol/L (ref 135–145)

## 2020-03-04 NOTE — Plan of Care (Signed)
  Problem: Acute Rehab PT Goals(only PT should resolve) Goal: Pt Will Go Supine/Side To Sit Outcome: Adequate for Discharge Goal: Patient Will Transfer Sit To/From Stand Outcome: Adequate for Discharge Goal: Pt Will Ambulate Outcome: Adequate for Discharge Goal: Pt/caregiver will Perform Home Exercise Program Outcome: Adequate for Discharge   Problem: Education: Goal: Knowledge of the prescribed therapeutic regimen will improve Outcome: Adequate for Discharge Goal: Understanding of discharge needs will improve Outcome: Adequate for Discharge Goal: Individualized Educational Video(s) Outcome: Adequate for Discharge   Problem: Activity: Goal: Ability to avoid complications of mobility impairment will improve Outcome: Adequate for Discharge Goal: Ability to tolerate increased activity will improve Outcome: Adequate for Discharge   Problem: Clinical Measurements: Goal: Postoperative complications will be avoided or minimized Outcome: Adequate for Discharge   Problem: Pain Management: Goal: Pain level will decrease with appropriate interventions Outcome: Adequate for Discharge   Problem: Skin Integrity: Goal: Will show signs of wound healing Outcome: Adequate for Discharge

## 2020-03-04 NOTE — TOC Transition Note (Signed)
Transition of Care [facility]) - CM/SW Discharge Note   Patient Details  Name: Charlotte Blackwell MRN: [id number] Date of Birth: 04-Dec-1950  [facility] CM/SW Contact:  Lennart Pall, LCSW Phone Number: 03/04/2020, 10:42 AM   Clinical Narrative:    Pt cleared for dc today.  Plan HEP.  Needs rolling walker - orders placed and referral to [facility].  No further TOC needs.   Final next level of care: Home/Self Care Barriers to Discharge: No Barriers Identified   Patient Goals and CMS Choice Patient states their goals for this hospitalization and ongoing recovery are:: home today CMS Medicare.gov Compare Post Acute Care list provided to:: Patient    Discharge Placement                       Discharge Plan and Services                DME Arranged: Walker rolling DME Agency: [employer] Date DME Agency Contacted: 03/04/20 Time DME Agency Contacted: [id number] Representative spoke with at DME Agency: Ovid Curd HH Arranged: NA [place] Agency: NA        Social Determinants of Health ([place]) Interventions     Readmission Risk Interventions No flowsheet data found.

## 2020-03-04 NOTE — Discharge Summary (Signed)
Patient ID: Charlotte Blackwell MRN: [id number] DOB/AGE: Oct 11, 1950 69 y.o.  Admit date: 03/03/2020 Discharge date: 03/04/2020  Admission Diagnoses:  Principal Problem:   Right hip OA Active Problems:   S/P right THA, AA   S/P total hip arthroplasty   Discharge Diagnoses:  Same  Past Medical History:  Diagnosis Date  . Arthritis   . Breast cancer ([place])    Left breast  . Hypertension   . Personal history of radiation therapy     Surgeries: Procedure(s): RIGHT TOTAL HIP ARTHROPLASTY ANTERIOR APPROACH on 03/03/2020   Consultants: N/A  Discharged Condition: Improved  Hospital Course: Charlotte Blackwell is an 69 y.o. female who was admitted 03/03/2020 for operative treatment ofOsteoarthritis of right hip. Patient has severe unremitting pain that affects sleep, daily activities, and work/hobbies. After pre-op clearance the patient was taken to the operating room on 03/03/2020 and underwent  Procedure(s): RIGHT TOTAL HIP ARTHROPLASTY ANTERIOR APPROACH.    Patient was given perioperative antibiotics:  Anti-infectives (From admission, onward)   Start     Dose/Rate Route Frequency Ordered Stop   03/03/20 2000  ceFAZolin (ANCEF) IVPB 1 g/50 mL premix        1 g 100 mL/hr over 30 Minutes Intravenous Every 6 hours 03/03/20 1538 03/04/20 0256   03/03/20 1944  ceFAZolin (ANCEF) 1-4 GM/50ML-% IVPB       Note to Pharmacy: Bernadene Person   : cabinet override      03/03/20 1944 03/03/20 2000   03/03/20 1200  ceFAZolin (ANCEF) IVPB 2g/100 mL premix        2 g 200 mL/hr over 30 Minutes Intravenous On call to O.R. 03/03/20 1153 03/03/20 1405       Patient was given sequential compression devices, early ambulation, and chemoprophylaxis to prevent DVT.  Patient benefited maximally from hospital stay and there were no complications.    Recent vital signs:  Patient Vitals for the past 24 hrs:  BP Temp Temp src Pulse Resp SpO2 Height Weight  03/04/20 0910 (!) 127/58 98.3 F (36.8  C) Oral 61 16 97 % -- --  03/04/20 0751 (!) 109/50 98.3 F (36.8 C) Oral 69 14 94 % -- --  03/04/20 0231 (!) 136/45 (!) 97.3 F (36.3 C) Axillary (!) 58 16 96 % -- --  03/03/20 2054 (!) 155/59 98.3 F (36.8 C) Oral 67 16 96 % -- --  03/03/20 2035 -- 97.8 F (36.6 C) -- 72 -- 97 % -- --  03/03/20 2000 138/69 -- -- 85 16 98 % -- --  03/03/20 1800 135/67 -- -- 88 -- 97 % -- --  03/03/20 1700 (!) 148/65 98.1 F (36.7 C) -- 75 17 100 % -- --  03/03/20 1645 139/79 98.1 F (36.7 C) -- 87 17 98 % -- --  03/03/20 1630 (!) 151/63 -- -- 83 18 98 % -- --  03/03/20 1615 106/83 -- -- 78 12 99 % -- --  03/03/20 1600 92/79 -- -- 77 10 100 % -- --  03/03/20 1545 100/63 -- -- 83 12 100 % -- --  03/03/20 1541 (!) 117/51 (!) 97.5 F (36.4 C) -- 86 20 100 % -- --  03/03/20 1217 -- -- -- -- -- -- 5\' 6"  (1.676 m) 59.2 kg  03/03/20 1152 (!) 172/74 98 F (36.7 C) Oral (!) 102 16 97 % -- --     Recent laboratory studies:  Recent Labs    03/04/20 0317  WBC 9.7  HGB 11.1*  HCT 32.8*  PLT 177  NA 135  K 3.7  CL 102  CO2 24  BUN 10  CREATININE 0.48  GLUCOSE 146*  CALCIUM 8.4*     Discharge Medications:   Allergies as of 03/04/2020   No Known Allergies     Medication List    STOP taking these medications   acetaminophen 650 MG CR tablet Commonly known as: TYLENOL   traMADol 50 MG tablet Commonly known as: ULTRAM     TAKE these medications   aspirin 81 MG chewable tablet Commonly known as: Aspirin Childrens Chew 1 tablet (81 mg total) by mouth 2 (two) times daily. Take for 4 weeks, then resume regular dose.   docusate sodium 100 MG capsule Commonly known as: Colace Take 1 capsule (100 mg total) by mouth 2 (two) times daily.   ferrous sulfate 325 (65 FE) MG tablet Commonly known as: FerrouSul Take 1 tablet (325 mg total) by mouth 3 (three) times daily with meals for 14 days.   HYDROcodone-acetaminophen 7.5-325 MG tablet Commonly known as: Norco Take 1-2 tablets by mouth  every 4 (four) hours as needed for moderate pain.   lisinopril 20 MG tablet Commonly known as: ZESTRIL Take 20 mg by mouth at bedtime.   methocarbamol 500 MG tablet Commonly known as: Robaxin Take 1 tablet (500 mg total) by mouth every 6 (six) hours as needed for muscle spasms.   multivitamin with minerals tablet Take 1 tablet by mouth daily.   polyethylene glycol 17 g packet Commonly known as: MIRALAX / GLYCOLAX Take 17 g by mouth 2 (two) times daily.   zolpidem 10 MG tablet Commonly known as: AMBIEN Take 10 mg by mouth at bedtime.            Discharge Care Instructions  (From admission, onward)         Start     Ordered   03/03/20 0000  Change dressing       Comments: Maintain surgical dressing until follow up in the clinic. If the edges start to pull up, may reinforce with tape. If the dressing is no longer working, may remove and cover with gauze and tape, but must keep the area dry and clean.  Call with any questions or concerns.   03/03/20 1350          Diagnostic Studies: DG Pelvis Portable  Result Date: 03/03/2020 CLINICAL DATA:  Right total hip arthroplasty EXAM: PORTABLE PELVIS 1-2 VIEWS COMPARISON:  None. FINDINGS: Single orthopedic view of the pelvis demonstrates surgical changes of right total hip arthroplasty. Arthroplasty components overlie the expected position. No unexpected fracture or dislocation. Moderate to severe left hip degenerative arthritis is incidentally noted. IMPRESSION: Status post right total hip arthroplasty. No evidence of fracture or dislocation. Electronically Signed   By: Fidela Salisbury MD   On: 03/03/2020 16:31   DG C-Arm 1-60 Min-No Report  Result Date: 03/03/2020 Fluoroscopy was utilized by the requesting physician.  No radiographic interpretation.   MM DIAG BREAST TOMO BILATERAL  Result Date: 02/26/2020 CLINICAL DATA:  69 year old female presenting for routine annual surveillance status post left breast lumpectomy in 2016.  EXAM: DIGITAL DIAGNOSTIC BILATERAL MAMMOGRAM WITH TOMO AND CAD COMPARISON:  Previous exam(s). ACR Breast Density Category c: The breast tissue is heterogeneously dense, which may obscure small masses. FINDINGS: The left breast lumpectomy site is stable. An asymmetry in the superior right breast resolves with spot compression tomosynthesis imaging. No suspicious calcifications, masses or areas of distortion are seen in  the bilateral breasts. Mammographic images were processed with CAD. IMPRESSION: Stable right breast lumpectomy site. No mammographic evidence of malignancy in the bilateral breasts. RECOMMENDATION: Screening mammogram in one year.(Code:[id number]) I have discussed the findings and recommendations with the patient. If applicable, a reminder letter will be sent to the patient regarding the next appointment. BI-RADS CATEGORY  2: Benign. Electronically Signed   By: Ammie Ferrier M.D.   On: 02/26/2020 13:59   DG HIP OPERATIVE UNILAT W OR W/O PELVIS RIGHT  Result Date: 03/03/2020 CLINICAL DATA:  Right hip replacement, intraoperative examination EXAM: OPERATIVE RIGHT HIP (WITH PELVIS IF PERFORMED) 1 VIEWS TECHNIQUE: Fluoroscopic spot image(s) were submitted for interpretation post-operatively. COMPARISON:  None. FINDINGS: Two fluoroscopic intraoperative radiographs are presented for interpretation postprocedurally. These images demonstrate intraoperative changes of right total hip arthroplasty procedure with placement of the acetabular cup and femoral stem components. The femoral head component is not yet visualized. No unexpected fracture or dislocation. Incidentally noted is mild to moderate degenerate arthritis of the left hip. FLUOROSCOPY TIME:  10 seconds IMPRESSION: Intraoperative radiographs as described above. Electronically Signed   By: Fidela Salisbury MD   On: 03/03/2020 15:55    Disposition: Home  Discharge Instructions    Call MD / Call 911   Complete by: As directed    If you  experience chest pain or shortness of breath, CALL 911 and be transported to the hospital emergency room.  If you develope a fever above 101 F, pus (white drainage) or increased drainage or redness at the wound, or calf pain, call your surgeon's office.   Change dressing   Complete by: As directed    Maintain surgical dressing until follow up in the clinic. If the edges start to pull up, may reinforce with tape. If the dressing is no longer working, may remove and cover with gauze and tape, but must keep the area dry and clean.  Call with any questions or concerns.   Constipation Prevention   Complete by: As directed    Drink plenty of fluids.  Prune juice may be helpful.  You may use a stool softener, such as Colace (over the counter) 100 mg twice a day.  Use MiraLax (over the counter) for constipation as needed.   Diet - low sodium heart healthy   Complete by: As directed    Discharge instructions   Complete by: As directed    Maintain surgical dressing until follow up in the clinic. If the edges start to pull up, may reinforce with tape. If the dressing is no longer working, may remove and cover with gauze and tape, but must keep the area dry and clean.  Follow up in 2 weeks at [facility]. Call with any questions or concerns.   Increase activity slowly as tolerated   Complete by: As directed    Weight bearing as tolerated with assist device (walker, cane, etc) as directed, use it as long as suggested by your surgeon or therapist, typically at least 4-6 weeks.   TED hose   Complete by: As directed    Use stockings (TED hose) for 2 weeks on both leg(s).  You may remove them at night for sleeping.       Follow-up Information    Paralee Cancel, MD. Schedule an appointment as soon as possible for a visit in 2 weeks.   Specialty: Orthopedic Surgery Contact information: [address] [place] [address] [phone]  Signed: Lucille Passy  [facility] 03/04/2020, 9:43 AM

## 2020-03-04 NOTE — Progress Notes (Signed)
     Subjective: 1 Day Post-Op Procedure(s) (LRB): TOTAL HIP ARTHROPLASTY ANTERIOR APPROACH (Right)   Patient reports pain as mild, pain controlled. No reported events throughout the night.  Discussed the procedure, findings and expectations moving forward.  Ready to be discharged home, if they do well with PT.  Follow up in the clinic in 2 weeks.  Knows to call with any questions or concerns.       Objective:   VITALS:   Vitals:   03/04/20 0751 03/04/20 0910  BP: (!) 109/50 (!) 127/58  Pulse: 69 61  Resp: 14 16  Temp: 98.3 F (36.8 C) 98.3 F (36.8 C)  SpO2: 94% 97%    Dorsiflexion/Plantar flexion intact Incision: dressing C/D/I No cellulitis present Compartment soft  LABS Recent Labs    03/04/20 0317  HGB 11.1*  HCT 32.8*  WBC 9.7  PLT 177    Recent Labs    03/04/20 0317  NA 135  K 3.7  BUN 10  CREATININE 0.48  GLUCOSE 146*     Assessment/Plan: 1 Day Post-Op Procedure(s) (LRB): TOTAL HIP ARTHROPLASTY ANTERIOR APPROACH (Right) Foley cath d/c'ed Advance diet Up with therapy D/C IV fluids Discharge home Follow up in 2 weeks at [facility] Follow up with OLIN,Byanca Kasper D in 2 weeks.  Contact information:  [facility] [address], [address] [place] [phone]         Danae Orleans PA-C  [facility]  [place] [address], [place], [place], [place] [address] Phone: [phone] [email or web address] Facebook  [employer]

## 2020-03-04 NOTE — Progress Notes (Signed)
Physical Therapy Treatment Patient Details Name: Charlotte Blackwell MRN: [id number] DOB: 17-Jun-1950 Today's Date: 03/04/2020    History of Present Illness Patient is 69 y.o. female s/p Rt THA anterior approach on 03/03/20 with PMH significant for HTN, OA, Breast Cancer.    PT Comments    Pt ambulated in hallway and had no difficulties with movement today.  Pt educated on safety and technique of exercises.  Pt feels ready for d/c home today and had no further questions.   Follow Up Recommendations  Follow surgeon's recommendation for DC plan and follow-up therapies     Equipment Recommendations  Rolling walker with 5" wheels    Recommendations for Other Services       Precautions / Restrictions Precautions Precautions: Fall Restrictions Other Position/Activity Restrictions: WBAT    Mobility  Bed Mobility Overal bed mobility: Modified Independent                Transfers Overall transfer level: Needs assistance Equipment used: Rolling walker (2 wheeled) Transfers: Sit to/from Stand Sit to Stand: Min guard;Supervision         General transfer comment: cues for hand placement  Ambulation/Gait Ambulation/Gait assistance: Min guard;Supervision Gait Distance (Feet): 200 Feet Assistive device: Rolling walker (2 wheeled) Gait Pattern/deviations: Step-through pattern;Decreased stride length     General Gait Details: verbal cues for sequence initially however pt able to perform reciprocating gait well, minimal use of RW   Stairs             Wheelchair Mobility    Modified Rankin (Stroke Patients Only)       Balance                                            Cognition Arousal/Alertness: Awake/alert Behavior During Therapy: WFL for tasks assessed/performed Overall Cognitive Status: Within Functional Limits for tasks assessed                                        Exercises Total Joint Exercises Ankle  Circles/Pumps: AROM;10 reps;Both Hip ABduction/ADduction: AROM;10 reps;Right;Standing Long Arc Quad: AROM;Right;10 reps;Seated Knee Flexion: AROM;Standing;Right;10 reps Marching in Standing: AROM;Right;10 reps;Standing Standing Hip Extension: AROM;Right;10 reps;Standing    General Comments        Pertinent Vitals/Pain Pain Assessment: No/denies pain    Home Living                      Prior Function            PT Goals (current goals can now be found in the care plan section) Progress towards PT goals: Progressing toward goals    Frequency    7X/week      PT Plan Current plan remains appropriate    Co-evaluation              AM-PAC PT "6 Clicks" Mobility   Outcome Measure  Help needed turning from your back to your side while in a flat bed without using bedrails?: None Help needed moving from lying on your back to sitting on the side of a flat bed without using bedrails?: None Help needed moving to and from a bed to a chair (including a wheelchair)?: A Little Help needed standing up from a chair using your arms (e.g., wheelchair  or bedside chair)?: A Little Help needed to walk in hospital room?: A Little Help needed climbing 3-5 steps with a railing? : A Little 6 Click Score: 20    End of Session Equipment Utilized During Treatment: Gait belt Activity Tolerance: Patient tolerated treatment well Patient left: in chair;with call bell/phone within reach Nurse Communication: Mobility status PT Visit Diagnosis: Muscle weakness (generalized) (M62.81);Difficulty in walking, not elsewhere classified (R26.2)     Time: 5361-4431 PT Time Calculation (min) (ACUTE ONLY): 20 min  Charges:  $Therapeutic Exercise: 8-22 mins                      Arlyce Dice, DPT Acute Rehabilitation Services Pager: [phone] Office: [phone]  Trena Platt 03/04/2020, 12:43 PM

## 2020-06-03 ENCOUNTER — Other Ambulatory Visit: Payer: Medicare Other

## 2020-08-31 ENCOUNTER — Telehealth: Payer: Self-pay | Admitting: Gastroenterology

## 2020-08-31 NOTE — Telephone Encounter (Signed)
Pt is requesting to schedule a colonoscopy with Dr Lyndel Safe, had one done in 2016 but will fax over the report to review

## 2020-09-21 ENCOUNTER — Encounter: Payer: Self-pay | Admitting: Nurse Practitioner

## 2020-09-21 ENCOUNTER — Other Ambulatory Visit: Payer: Self-pay

## 2020-09-21 ENCOUNTER — Ambulatory Visit (INDEPENDENT_AMBULATORY_CARE_PROVIDER_SITE_OTHER): Payer: Medicare Other | Admitting: Nurse Practitioner

## 2020-09-21 VITALS — BP 116/65 | HR 99 | Temp 99.2°F | Ht 66.0 in | Wt 135.3 lb

## 2020-09-21 DIAGNOSIS — I10 Essential (primary) hypertension: Secondary | ICD-10-CM | POA: Diagnosis not present

## 2020-09-21 DIAGNOSIS — Z7689 Persons encountering health services in other specified circumstances: Secondary | ICD-10-CM

## 2020-09-21 DIAGNOSIS — F5101 Primary insomnia: Secondary | ICD-10-CM | POA: Diagnosis not present

## 2020-09-21 MED ORDER — ZOLPIDEM TARTRATE 10 MG PO TABS: 10.0000 mg | ORAL_TABLET | Freq: Every evening | ORAL | 1 refills | Status: DC | PRN

## 2020-09-21 NOTE — Progress Notes (Signed)
New Patient Office Visit  Subjective:  Patient ID: Charlotte Blackwell, female    DOB: 21-Mar-1951  Age: 70 y.o. MRN: [id number]  CC:  Chief Complaint  Patient presents with  . New Patient (Initial Visit)    HPI [name] presents to establish new primary care provider. She states that she moved to the area from [place], [place], about three years ago. She initially kept her PCP in [place] and went back once a year to have her wellness visit. She has been essentially healthy. She is treated for hypertension. This is well controlled on current medication. She also takes ambien at night to help he sleep. She states that she takes this most every night. She does have a history of chronic insomnia. She states that she has been without her medications for past two weeks. Has not been able to fall asleep until 5 or 6 am and is getting up around 10 am. She has tried multiple medications to help her sleep. None have helped as well as the generic ambien. She is aware of the potential risk factors and side effects.  She states that she has not experienced the negative side effects. She is constantly monitoring herself for long term effects of taking ambien. Reviewed her PDMP profile today. He overdose risk score was 320. She did have a hip replacement in 02/2020 for which she was prescribed narcotic pain medications. She is no longer on any pain medication. The last fill of ambien was 06/08/2020.  Her blood pressure is well managed on current dose lisinopril. She denies chest pain, chest pressure, or shortness of breath. She denies headaches or visual disturbances. she denies abdominal pain, nausea, vomiting, or changes in bowel or bladder habits.  Her last wellness visit with her PCP in [place] was 02/2020.   Past Medical History:  Diagnosis Date  . Arthritis   . Breast cancer ([place])    Left breast  . Hypertension   . Personal history of radiation therapy     Past Surgical History:   Procedure Laterality Date  . APPENDECTOMY    . BREAST LUMPECTOMY Left 2016  . COLONOSCOPY    . TONSILLECTOMY    . TOTAL HIP ARTHROPLASTY Right 03/03/2020   Procedure: TOTAL HIP ARTHROPLASTY ANTERIOR APPROACH;  Surgeon: Paralee Cancel, MD;  Location: [facility];  Service: Orthopedics;  Laterality: Right;  70 mins  . WISDOM TOOTH EXTRACTION      History reviewed. No pertinent family history.  Social History   Socioeconomic History  . Marital status: Widowed    Spouse name: Not on file  . Number of children: Not on file  . Years of education: Not on file  . Highest education level: Not on file  Occupational History  . Not on file  Tobacco Use  . Smoking status: Current Every Day Smoker    Packs/day: 1.00  . Smokeless tobacco: Never Used  . Tobacco comment: Starting smoking at age 70  Vaping Use  . Vaping Use: Never used  Substance and Sexual Activity  . Alcohol use: Never    Comment: very rare  . Drug use: Never  . Sexual activity: Not on file  Other Topics Concern  . Not on file  Social History Narrative  . Not on file   Social Determinants of Health   Financial Resource Strain: Not on file  Food Insecurity: Not on file  Transportation Needs: Not on file  Physical Activity: Not on file  Stress: Not on file  Social Connections: Not on file  Intimate Partner Violence: Not on file    ROS Review of Systems  Constitutional: Positive for fatigue. Negative for activity change, chills and fever.       She is feeling fatigued since she has been unable to sleep.   HENT: Negative for congestion, postnasal drip, rhinorrhea, sinus pressure and sinus pain.   Eyes: Negative.   Respiratory: Negative for cough, shortness of breath and wheezing.   Cardiovascular: Negative for chest pain and palpitations.  Gastrointestinal: Negative for constipation, diarrhea, nausea and vomiting.  Endocrine: Negative for cold intolerance, heat intolerance, polydipsia and polyuria.   Musculoskeletal: Negative for back pain and myalgias.  Skin: Negative for rash.  Allergic/Immunologic: Negative.   Neurological: Negative for dizziness, weakness and headaches.  Hematological: Negative.   Psychiatric/Behavioral: Positive for sleep disturbance. The patient is not nervous/anxious.   All other systems reviewed and are negative.   Objective:   Today's Vitals   09/21/20 1446  BP: 116/65  Pulse: 99  Temp: 99.2 F (37.3 C)  SpO2: 97%  Weight: 135 lb 4.8 oz (61.4 kg)  Height: 5\' 6"  (1.676 m)   Body mass index is 21.84 kg/m.   Physical Exam Vitals and nursing note reviewed.  Constitutional:      Appearance: Normal appearance. She is well-developed.  HENT:     Head: Normocephalic and atraumatic.     Nose: Nose normal.  Eyes:     Extraocular Movements: Extraocular movements intact.     Conjunctiva/sclera: Conjunctivae normal.     Pupils: Pupils are equal, round, and reactive to light.  Neck:     Vascular: No carotid bruit.  Cardiovascular:     Rate and Rhythm: Normal rate and regular rhythm.     Pulses: Normal pulses.     Heart sounds: Normal heart sounds.  Pulmonary:     Effort: Pulmonary effort is normal.     Breath sounds: Normal breath sounds.  Abdominal:     Palpations: Abdomen is soft.  Musculoskeletal:        General: Normal range of motion.     Cervical back: Normal range of motion and neck supple.  Skin:    General: Skin is warm and dry.     Capillary Refill: Capillary refill takes less than 2 seconds.  Neurological:     General: No focal deficit present.     Mental Status: She is alert and oriented to person, place, and time.  Psychiatric:        Mood and Affect: Mood normal.        Behavior: Behavior normal.        Thought Content: Thought content normal.        Judgment: Judgment normal.     Assessment & Plan:  1. Encounter to establish care Appointment today to establish new primary care provider.   2. Essential  hypertension Blood pressure well controlled. Continue lisinopril at current dose.   3. Primary insomnia May take ambient at bedtime as needed. Reviewed risks and side effects associated with long term use of ambien. She voiced understanding.  - zolpidem (AMBIEN) 10 MG tablet; Take 1 tablet (10 mg total) by mouth at bedtime as needed for sleep.  Dispense: 90 tablet; Refill: 1  Problem List Items Addressed This Visit      Cardiovascular and Mediastinum   Essential hypertension     Other   Encounter to establish care - Primary   Primary insomnia   Relevant Medications  zolpidem (AMBIEN) 10 MG tablet      Outpatient Encounter Medications as of 09/21/2020  Medication Sig  . lisinopril (ZESTRIL) 20 MG tablet Take 20 mg by mouth at bedtime.   . [DISCONTINUED] zolpidem (AMBIEN) 10 MG tablet Take 10 mg by mouth at bedtime.  Marland Kitchen zolpidem (AMBIEN) 10 MG tablet Take 1 tablet (10 mg total) by mouth at bedtime as needed for sleep.  . [DISCONTINUED] docusate sodium (COLACE) 100 MG capsule Take 1 capsule (100 mg total) by mouth 2 (two) times daily.  . [DISCONTINUED] ferrous sulfate (FERROUSUL) 325 (65 FE) MG tablet Take 1 tablet (325 mg total) by mouth 3 (three) times daily with meals for 14 days.  . [DISCONTINUED] HYDROcodone-acetaminophen (NORCO) 7.5-325 MG tablet Take 1-2 tablets by mouth every 4 (four) hours as needed for moderate pain.  . [DISCONTINUED] methocarbamol (ROBAXIN) 500 MG tablet Take 1 tablet (500 mg total) by mouth every 6 (six) hours as needed for muscle spasms.  . [DISCONTINUED] Multiple Vitamins-Minerals (MULTIVITAMIN WITH MINERALS) tablet Take 1 tablet by mouth daily.  . [DISCONTINUED] polyethylene glycol (MIRALAX / GLYCOLAX) 17 g packet Take 17 g by mouth 2 (two) times daily.   No facility-administered encounter medications on file as of 09/21/2020.    Follow-up: Return in about 5 months (around 02/21/2021) for Nicut - will need FBW week before.  - we need to get medical records  from prior PCP in [place]. ty.   Ronnell Freshwater, NP

## 2020-09-21 NOTE — Patient Instructions (Signed)

## 2020-10-05 DIAGNOSIS — I10 Essential (primary) hypertension: Secondary | ICD-10-CM | POA: Insufficient documentation

## 2020-10-05 DIAGNOSIS — F5101 Primary insomnia: Secondary | ICD-10-CM | POA: Insufficient documentation

## 2020-10-05 DIAGNOSIS — Z7689 Persons encountering health services in other specified circumstances: Secondary | ICD-10-CM | POA: Insufficient documentation

## 2020-10-13 ENCOUNTER — Telehealth: Payer: Self-pay | Admitting: Nurse Practitioner

## 2020-10-13 NOTE — Telephone Encounter (Signed)
Patient states her blood pressure was high on Saturday and it was consistently between 160-180 and bottom number in 70's but never over 80. Patient is on blood pressure medication, lisinopril. It came down Sunday, and more on Monday and today patient states she is fine. Please advise, thanks.

## 2020-10-13 NOTE — Telephone Encounter (Signed)
Patient told to keep a blood pressure log and explained to check in the morning and evening and scheduled for next week.

## 2020-10-13 NOTE — Telephone Encounter (Signed)
Please contact patient to schedule an appointment for her to discuss these issues with [facility]. Ask patient to keep a log of BPs (check 1 in the am and 1 in the pm) and bring log with her. If patient BP consistently above 130/90 call the office. AS, CMA

## 2020-10-22 ENCOUNTER — Other Ambulatory Visit: Payer: Self-pay

## 2020-10-22 ENCOUNTER — Ambulatory Visit (INDEPENDENT_AMBULATORY_CARE_PROVIDER_SITE_OTHER): Payer: Medicare Other | Admitting: Nurse Practitioner

## 2020-10-22 ENCOUNTER — Encounter: Payer: Self-pay | Admitting: Nurse Practitioner

## 2020-10-22 VITALS — BP 136/65 | HR 74 | Temp 98.0°F | Ht 66.0 in | Wt 135.4 lb

## 2020-10-22 DIAGNOSIS — I10 Essential (primary) hypertension: Secondary | ICD-10-CM | POA: Diagnosis not present

## 2020-10-22 MED ORDER — LISINOPRIL 20 MG PO TABS: 30.0000 mg | ORAL_TABLET | Freq: Every day | ORAL | 1 refills | Status: DC

## 2020-10-22 NOTE — Progress Notes (Signed)
Established Patient Office Visit  Subjective:  Patient ID: Charlotte Blackwell, female    DOB: 08/10/1950  Age: 70 y.o. MRN: [id number]  CC:  Chief Complaint  Patient presents with   Blood Pressure Check    HPI Charlotte Blackwell presents for evaluation of her blood pressure. She states that about three weeks ago, she woke up with severe headache. She was also experiencing some sinus type symptoms. States that she took her blood pressure and it was elevated. That day, she states that her systolic pressure was between 150 and 180. She states that she did eat dinner that night. She states that she developed nausea and abdominal cramping and just felt very ill. States that she was able to fall asleep after taking a sleepin pill. When she woke up in the morning, she felt some better. Checked her blood pressure multiple times that day. Was running between 145 and 155. She states that her other symptoms had resolved. She states that since October 14, 2020, she has been keeping a log of her blood pressure. She has been checking it 4 to 5 times every day. Blood pressure is best in the mornings. Systolic pressure is running in the 110s to 130s and it gets higher as the day goes on. In the evenings, her systolic blood pressure has been between 150 and 170. She is taking lisinopril 20mg  every evening.  Since the initial symptoms started, she has had no further negative symptoms. She denies headache, chest pain, chest pressure, palpitations, or dizziness. She denies nausea, vomiting, or abdominal cramping.    Past Medical History:  Diagnosis Date   Arthritis    Breast cancer ([place])    Left breast   Hypertension    Personal history of radiation therapy     Past Surgical History:  Procedure Laterality Date   APPENDECTOMY     BREAST LUMPECTOMY Left 2016   COLONOSCOPY     TONSILLECTOMY     TOTAL HIP ARTHROPLASTY Right 03/03/2020   Procedure: TOTAL HIP ARTHROPLASTY ANTERIOR APPROACH;  Surgeon: Paralee Cancel, MD;  Location: [facility];  Service: Orthopedics;  Laterality: Right;  70 mins   WISDOM TOOTH EXTRACTION      History reviewed. No pertinent family history.  Social History   Socioeconomic History   Marital status: Widowed    Spouse name: Not on file   Number of children: Not on file   Years of education: Not on file   Highest education level: Not on file  Occupational History   Not on file  Tobacco Use   Smoking status: Every Day    Packs/day: 1.00    Pack years: 0.00    Types: Cigarettes   Smokeless tobacco: Never   Tobacco comments:    Starting smoking at age 12  Vaping Use   Vaping Use: Never used  Substance and Sexual Activity   Alcohol use: Never    Comment: very rare   Drug use: Never   Sexual activity: Not on file  Other Topics Concern   Not on file  Social History Narrative   Not on file   Social Determinants of Health   Financial Resource Strain: Not on file  Food Insecurity: Not on file  Transportation Needs: Not on file  Physical Activity: Not on file  Stress: Not on file  Social Connections: Not on file  Intimate Partner Violence: Not on file    Outpatient Medications Prior to Visit  Medication Sig Dispense Refill  zolpidem (AMBIEN) 10 MG tablet Take 1 tablet (10 mg total) by mouth at bedtime as needed for sleep. 90 tablet 1   lisinopril (ZESTRIL) 20 MG tablet Take 20 mg by mouth at bedtime.      No facility-administered medications prior to visit.    No Known Allergies  ROS Review of Systems  Constitutional:  Negative for activity change, chills, fatigue and fever.  HENT:  Negative for congestion, postnasal drip, rhinorrhea, sinus pressure and sinus pain.   Eyes: Negative.   Respiratory:  Negative for cough, chest tightness, shortness of breath and wheezing.   Cardiovascular:  Negative for chest pain and palpitations.       Elevated blood pressure  Gastrointestinal:  Negative for constipation, diarrhea, nausea and vomiting.   Endocrine: Negative for cold intolerance, heat intolerance, polydipsia and polyuria.  Genitourinary: Negative.   Musculoskeletal: Negative.  Negative for back pain and myalgias.  Skin: Negative.  Negative for rash.  Allergic/Immunologic: Positive for environmental allergies.  Neurological:  Negative for dizziness, speech difficulty, weakness and headaches.  Psychiatric/Behavioral:  Positive for sleep disturbance. The patient is not nervous/anxious.   All other systems reviewed and are negative.    Objective:    Physical Exam Vitals and nursing note reviewed.  Constitutional:      Appearance: Normal appearance. She is well-developed.  HENT:     Head: Normocephalic and atraumatic.     Nose: Nose normal.     Mouth/Throat:     Mouth: Mucous membranes are moist.  Eyes:     Extraocular Movements: Extraocular movements intact.     Conjunctiva/sclera: Conjunctivae normal.     Pupils: Pupils are equal, round, and reactive to light.  Neck:     Vascular: No carotid bruit.  Cardiovascular:     Rate and Rhythm: Normal rate and regular rhythm.     Pulses: Normal pulses.     Heart sounds: Normal heart sounds.  Pulmonary:     Effort: Pulmonary effort is normal.     Breath sounds: Normal breath sounds.  Abdominal:     Palpations: Abdomen is soft.  Musculoskeletal:        General: Normal range of motion.     Cervical back: Normal range of motion and neck supple.  Lymphadenopathy:     Cervical: No cervical adenopathy.  Skin:    General: Skin is warm and dry.     Capillary Refill: Capillary refill takes less than 2 seconds.  Neurological:     General: No focal deficit present.     Mental Status: She is alert and oriented to person, place, and time.  Psychiatric:        Mood and Affect: Mood normal.        Behavior: Behavior normal.        Thought Content: Thought content normal.        Judgment: Judgment normal.    Today's Vitals   10/22/20 1315  BP: 136/65  Pulse: 74  Temp:  98 F (36.7 C)  SpO2: 99%  Weight: 135 lb 6.4 oz (61.4 kg)  Height: 5\' 6"  (1.676 m)   Body mass index is 21.85 kg/m.   Wt Readings from Last 3 Encounters:  10/22/20 135 lb 6.4 oz (61.4 kg)  09/21/20 135 lb 4.8 oz (61.4 kg)  03/03/20 130 lb 9 oz (59.2 kg)     Health Maintenance Due  Topic Date Due   Pneumococcal Vaccine 18-35 Years old (1 - PCV) Never done   COLONOSCOPY (Pts  45-59yrs Insurance coverage will need to be confirmed)  Never done   DEXA SCAN  Never done   PNA vac Low Risk Adult (1 of 2 - PCV13) Never done   Zoster Vaccines- Shingrix (2 of 2) 04/27/2018   COVID-19 Vaccine (3 - Booster for Pfizer series) 12/10/2019    There are no preventive care reminders to display for this patient.  No results found for: TSH Lab Results  Component Value Date   WBC 9.7 03/04/2020   HGB 11.1 (L) 03/04/2020   HCT 32.8 (L) 03/04/2020   MCV 94.5 03/04/2020   PLT 177 03/04/2020   Lab Results  Component Value Date   NA 135 03/04/2020   K 3.7 03/04/2020   CO2 24 03/04/2020   GLUCOSE 146 (H) 03/04/2020   BUN 10 03/04/2020   CREATININE 0.48 03/04/2020   CALCIUM 8.4 (L) 03/04/2020   ANIONGAP 9 03/04/2020     Assessment & Plan:  1. Essential hypertension Will increase lisinopril to 30mg  every evening. Encouraged patient to continue logging blood pressure. The goal is to have systolic pressure between 120 and 130. If it continues to be consistently higher than that, she will notify the office.  - lisinopril (ZESTRIL) 20 MG tablet; Take 1.5 tablets (30 mg total) by mouth at bedtime.  Dispense: 135 tablet; Refill: 1   Problem List Items Addressed This Visit       Cardiovascular and Mediastinum   Essential hypertension - Primary   Relevant Medications   lisinopril (ZESTRIL) 20 MG tablet    Meds ordered this encounter  Medications   lisinopril (ZESTRIL) 20 MG tablet    Sig: Take 1.5 tablets (30 mg total) by mouth at bedtime.    Dispense:  135 tablet    Refill:  1     Increasing dose from prior 20mg  dose    Order Specific Question:   Supervising Provider    Answer:   Beatrice Lecher D [[id number]]    Follow-up: Return for prn worsening or persistent symptoms. MWV as already scheduled .    Ronnell Freshwater, NP

## 2020-12-10 NOTE — Telephone Encounter (Signed)
Patient called to schedule requested records for review still pending.

## 2021-01-04 NOTE — Telephone Encounter (Signed)
Hey Dr. Lyndel Safe,   We received records for patient to have colonoscopy. Last done in 2016. I have records. Could please review and advise on scheduling?  Thank you

## 2021-01-08 NOTE — Telephone Encounter (Signed)
I do not see her previous colonoscopy under media tab or under procedure tab. [name], do you have the colonoscopy report. You can give it to me next week when we have the clinic  RG

## 2021-01-11 NOTE — Telephone Encounter (Signed)
Dr Lyndel Safe reviewed and records given to scheduler to schedule

## 2021-01-19 NOTE — Telephone Encounter (Signed)
Attempted to contact pt to schedule procedure but call is not going through

## 2021-02-08 ENCOUNTER — Other Ambulatory Visit: Payer: Self-pay | Admitting: Nurse Practitioner

## 2021-02-08 DIAGNOSIS — Z1231 Encounter for screening mammogram for malignant neoplasm of breast: Secondary | ICD-10-CM

## 2021-02-18 ENCOUNTER — Other Ambulatory Visit: Payer: Self-pay

## 2021-02-18 DIAGNOSIS — Z0000 Encounter for general adult medical examination without abnormal findings: Secondary | ICD-10-CM

## 2021-02-19 ENCOUNTER — Other Ambulatory Visit: Payer: Self-pay

## 2021-02-19 ENCOUNTER — Other Ambulatory Visit: Payer: Medicare Other

## 2021-02-19 DIAGNOSIS — Z0000 Encounter for general adult medical examination without abnormal findings: Secondary | ICD-10-CM

## 2021-02-19 DIAGNOSIS — E785 Hyperlipidemia, unspecified: Secondary | ICD-10-CM

## 2021-02-19 DIAGNOSIS — I10 Essential (primary) hypertension: Secondary | ICD-10-CM

## 2021-02-19 DIAGNOSIS — Z1231 Encounter for screening mammogram for malignant neoplasm of breast: Secondary | ICD-10-CM

## 2021-02-19 NOTE — Addendum Note (Signed)
Addended by: Vivia Birmingham on: 02/19/2021 11:17 AM   Modules accepted: Orders

## 2021-02-19 NOTE — Addendum Note (Signed)
Addended by: Vivia Birmingham on: 02/19/2021 11:25 AM   Modules accepted: Orders

## 2021-02-20 LAB — COMPREHENSIVE METABOLIC PANEL
ALT: 14 IU/L (ref 0–32)
AST: 18 IU/L (ref 0–40)
Albumin/Globulin Ratio: 1.6 (ref 1.2–2.2)
Albumin: 4.8 g/dL — ABNORMAL HIGH (ref 3.7–4.7)
Alkaline Phosphatase: 97 IU/L (ref 44–121)
BUN/Creatinine Ratio: 21 (ref 12–28)
BUN: 14 mg/dL (ref 8–27)
Bilirubin Total: 0.4 mg/dL (ref 0.0–1.2)
CO2: 22 mmol/L (ref 20–29)
Calcium: 9.8 mg/dL (ref 8.7–10.3)
Chloride: 101 mmol/L (ref 96–106)
Creatinine, Ser: 0.68 mg/dL (ref 0.57–1.00)
Globulin, Total: 3 g/dL (ref 1.5–4.5)
Glucose: 85 mg/dL (ref 70–99)
Potassium: 4.6 mmol/L (ref 3.5–5.2)
Sodium: 140 mmol/L (ref 134–144)
Total Protein: 7.8 g/dL (ref 6.0–8.5)
eGFR: 92 mL/min/{1.73_m2} (ref >59)

## 2021-02-20 LAB — CBC
Hematocrit: 45.9 % (ref 34.0–46.6)
Hemoglobin: 15.2 g/dL (ref 11.1–15.9)
MCH: 30.3 pg (ref 26.6–33.0)
MCHC: 33.1 g/dL (ref 31.5–35.7)
MCV: 91 fL (ref 79–97)
Platelets: 221 10*3/uL (ref 150–450)
RBC: 5.02 x10E6/uL (ref 3.77–5.28)
RDW: 11.9 % (ref 11.7–15.4)
WBC: 7.2 10*3/uL (ref 3.4–10.8)

## 2021-02-20 LAB — LIPID PANEL
Chol/HDL Ratio: 3.8 ratio (ref 0.0–4.4)
Cholesterol, Total: 175 mg/dL (ref 100–199)
HDL: 46 mg/dL (ref >39)
LDL Chol Calc (NIH): 112 mg/dL — ABNORMAL HIGH (ref 0–99)
Triglycerides: 94 mg/dL (ref 0–149)
VLDL Cholesterol Cal: 17 mg/dL (ref 5–40)

## 2021-02-20 LAB — TSH: TSH: 1.29 u[IU]/mL (ref 0.450–4.500)

## 2021-02-21 NOTE — Progress Notes (Signed)
Labs good. Review with patient at visit 02/22/2021.

## 2021-02-22 ENCOUNTER — Encounter: Payer: Self-pay | Admitting: Nurse Practitioner

## 2021-02-22 ENCOUNTER — Ambulatory Visit (INDEPENDENT_AMBULATORY_CARE_PROVIDER_SITE_OTHER): Payer: Medicare Other | Admitting: Nurse Practitioner

## 2021-02-22 ENCOUNTER — Other Ambulatory Visit: Payer: Self-pay

## 2021-02-22 VITALS — BP 119/66 | HR 92 | Temp 98.0°F | Ht 66.0 in | Wt 132.1 lb

## 2021-02-22 DIAGNOSIS — Z1211 Encounter for screening for malignant neoplasm of colon: Secondary | ICD-10-CM | POA: Insufficient documentation

## 2021-02-22 DIAGNOSIS — Z13820 Encounter for screening for osteoporosis: Secondary | ICD-10-CM | POA: Insufficient documentation

## 2021-02-22 DIAGNOSIS — Z0000 Encounter for general adult medical examination without abnormal findings: Secondary | ICD-10-CM | POA: Insufficient documentation

## 2021-02-22 DIAGNOSIS — I10 Essential (primary) hypertension: Secondary | ICD-10-CM | POA: Diagnosis not present

## 2021-02-22 DIAGNOSIS — M1990 Unspecified osteoarthritis, unspecified site: Secondary | ICD-10-CM | POA: Insufficient documentation

## 2021-02-22 DIAGNOSIS — F5101 Primary insomnia: Secondary | ICD-10-CM | POA: Diagnosis not present

## 2021-02-22 DIAGNOSIS — E2839 Other primary ovarian failure: Secondary | ICD-10-CM

## 2021-02-22 MED ORDER — ZOLPIDEM TARTRATE 10 MG PO TABS: 10.0000 mg | ORAL_TABLET | Freq: Every evening | ORAL | 1 refills | Status: DC | PRN

## 2021-02-22 MED ORDER — MELOXICAM 15 MG PO TABS: 15.0000 mg | ORAL_TABLET | Freq: Every day | ORAL | 0 refills | Status: DC

## 2021-02-22 MED ORDER — LISINOPRIL 20 MG PO TABS: 30.0000 mg | ORAL_TABLET | Freq: Every day | ORAL | 1 refills | Status: DC

## 2021-02-22 NOTE — Progress Notes (Signed)
Subjective:   Charlotte Blackwell is a 70 y.o. female who presents for Medicare Annual (Subsequent) preventive examination.  She states she is feeling well.  Blood pressure is well managed since we increased lisinopril to 20 mg.  She did have routine, fasting labs done prior to this visit.  LDL slightly elevated with remainder of lipid panel being normal.  Lipid Panel     Component Value Date/Time   CHOL 175 02/19/2021 1155   TRIG 94 02/19/2021 1155   HDL 46 02/19/2021 1155   CHOLHDL 3.8 02/19/2021 1155   LDLCALC 112 (H) 02/19/2021 1155   LABVLDL 17 02/19/2021 1155    She has no physical concerns or complaints today.  She denies chest pain, chest pressure, or shortness of breath. She denies headaches or visual disturbances. She denies abdominal pain, nausea, vomiting, or changes in bowel or bladder habits.   She is due to have screening colonoscopy.  She has a GI provider at [facility] she would like to see. She is due to have bone density test.  Review of Systems    Review of Systems  Constitutional:  Negative for fever, malaise/fatigue and weight loss.  HENT:  Negative for congestion, ear discharge, ear pain, hearing loss and sore throat.   Eyes: Negative.   Respiratory:  Negative for cough, shortness of breath and wheezing.   Cardiovascular:  Negative for chest pain and orthopnea.  Gastrointestinal:  Negative for abdominal pain, blood in stool, constipation, diarrhea, nausea and vomiting.  Genitourinary:  Negative for dysuria, flank pain, frequency and urgency.  Musculoskeletal:  Positive for joint pain and myalgias. Negative for back pain.       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  Will take meloxicam when needed for pain and inflammation if Tylenol does not work.  This does very well and come trolling her pain.  Skin:  Negative for itching and rash.  Neurological:  Negative for dizziness, weakness and headaches.   Psychiatric/Behavioral:  Negative for depression. The patient has insomnia.          Objective:   Physical Exam Vitals and nursing note reviewed.  Constitutional:      Appearance: Normal appearance. She is well-developed.  HENT:     Head: Normocephalic and atraumatic.     Right Ear: Tympanic membrane, ear canal and external ear normal.     Left Ear: Tympanic membrane, ear canal and external ear normal.     Nose: Nose normal.     Mouth/Throat:     Mouth: Mucous membranes are moist.     Pharynx: Oropharynx is clear.  Eyes:     Extraocular Movements: Extraocular movements intact.     Conjunctiva/sclera: Conjunctivae normal.     Pupils: Pupils are equal, round, and reactive to light.  Neck:     Vascular: No carotid bruit.  Cardiovascular:     Rate and Rhythm: Normal rate and regular rhythm.     Pulses: Normal pulses.     Heart sounds: Normal heart sounds.  Pulmonary:     Effort: Pulmonary effort is normal.     Breath sounds: Normal breath sounds.  Abdominal:     General: Bowel sounds are normal.     Palpations: Abdomen is soft.     Tenderness: There is no abdominal tenderness.  Musculoskeletal:        General: Normal range of motion.     Cervical back: Normal range of motion and neck supple.  Lymphadenopathy:  Cervical: No cervical adenopathy.  Skin:    General: Skin is warm and dry.     Capillary Refill: Capillary refill takes less than 2 seconds.  Neurological:     General: No focal deficit present.     Mental Status: She is alert and oriented to person, place, and time.  Psychiatric:        Mood and Affect: Mood normal.        Behavior: Behavior normal.        Thought Content: Thought content normal.        Judgment: Judgment normal.    Today's Vitals   02/22/21 1320  BP: 119/66  Pulse: 92  Temp: 98 F (36.7 C)  SpO2: 98%  Weight: 132 lb 1.6 oz (59.9 kg)  Height: 5\' 6"  (1.676 m)   Body mass index is 21.32 kg/m.  Advanced Directives 03/03/2020  02/25/2020  Does Patient Have a Medical Advance Directive? Yes Yes  Type of Advance Directive Healthcare Power of [place]  Does patient want to make changes to medical advance directive? No - Patient declined -  Copy of [place] in Chart? Yes - validated most recent copy scanned in chart (See row information) Yes - validated most recent copy scanned in chart (See row information)    Current Medications (verified) Outpatient Encounter Medications as of 02/22/2021  Medication Sig   meloxicam (MOBIC) 15 MG tablet Take 1 tablet (15 mg total) by mouth daily.   [DISCONTINUED] lisinopril (ZESTRIL) 20 MG tablet Take 1.5 tablets (30 mg total) by mouth at bedtime.   [DISCONTINUED] zolpidem (AMBIEN) 10 MG tablet Take 1 tablet (10 mg total) by mouth at bedtime as needed for sleep.   lisinopril (ZESTRIL) 20 MG tablet Take 1.5 tablets (30 mg total) by mouth at bedtime.   zolpidem (AMBIEN) 10 MG tablet Take 1 tablet (10 mg total) by mouth at bedtime as needed for sleep.   No facility-administered encounter medications on file as of 02/22/2021.    Allergies (verified) Patient has no known allergies.   History: Past Medical History:  Diagnosis Date   Arthritis    Breast cancer ([place])    Left breast   Hypertension    Personal history of radiation therapy    Past Surgical History:  Procedure Laterality Date   APPENDECTOMY     BREAST LUMPECTOMY Left 2016   COLONOSCOPY     TONSILLECTOMY     TOTAL HIP ARTHROPLASTY Right 03/03/2020   Procedure: TOTAL HIP ARTHROPLASTY ANTERIOR APPROACH;  Surgeon: Paralee Cancel, MD;  Location: [facility];  Service: Orthopedics;  Laterality: Right;  70 mins   WISDOM TOOTH EXTRACTION     History reviewed. No pertinent family history. Social History   Socioeconomic History   Marital status: Widowed    Spouse name: Not on file   Number of children: Not on file   Years of education: Not on file   Highest education  level: Not on file  Occupational History   Not on file  Tobacco Use   Smoking status: Every Day    Packs/day: 1.00    Types: Cigarettes   Smokeless tobacco: Never   Tobacco comments:    Starting smoking at age 2  Vaping Use   Vaping Use: Never used  Substance and Sexual Activity   Alcohol use: Never    Comment: very rare   Drug use: Never   Sexual activity: Not on file  Other Topics Concern   Not  on file  Social History Narrative   Not on file   Social Determinants of Health   Financial Resource Strain: Not on file  Food Insecurity: Not on file  Transportation Needs: Not on file  Physical Activity: Not on file  Stress: Not on file  Social Connections: Not on file    Tobacco Counseling Patient is a current, everyday smoker. She started smoking at age 69. She is not ready to quit.    Diabetic?no     Activities of Daily Living In your present state of health, do you have any difficulty performing the following activities: 02/22/2021 10/22/2020  Hearing? N N  Vision? N N  Difficulty concentrating or making decisions? N N  Walking or climbing stairs? N N  Comment - -  Dressing or bathing? N N  Doing errands, shopping? N N  Some recent data might be hidden    Patient Care Team: Ronnell Freshwater, NP as PCP - General (Family Medicine)  Indicate any recent Medical Services you may have received from other than [facility] providers in the past year (date may be approximate).     Assessment:  1. Encounter for Medicare annual wellness exam Annual Medicare wellness visit today.  2. Essential hypertension Blood pressure stable.  She should continue to take lisinopril at 30 mg dose daily.  New prescription sent to her pharmacy today. - lisinopril (ZESTRIL) 20 MG tablet; Take 1.5 tablets (30 mg total) by mouth at bedtime.  Dispense: 135 tablet; Refill: 1  3. Primary insomnia Patient may continue to take Ambien 10 mg tablets as needed at bedtime for insomnia.  Reviewed  risks of taking this medication for long periods of time, especially after age 41.  The patient voiced understanding of the risks and would like to fill her Ambien anyway. - zolpidem (AMBIEN) 10 MG tablet; Take 1 tablet (10 mg total) by mouth at bedtime as needed for sleep.  Dispense: 90 tablet; Refill: 1  4. Inflammatory osteoarthritis Patient does take meloxicam 15 mg as needed for pain and inflammation of the joints, especially in hands and fingers.  No new prescription sent today, but will send refill as needed.  Patient to notify office of any new prescription is needed. - meloxicam (MOBIC) 15 MG tablet; Take 1 tablet (15 mg total) by mouth daily.  Dispense: 90 tablet; Refill: 0  5. Ovarian failure Will get baseline bone density test.  Patient to try and schedule bone density along with upcoming mammogram. - DG Bone Density; Future  6. Screening for osteoporosis Will get baseline bone density test.  Patient to try and schedule bone density along with upcoming mammogram. - DG Bone Density; Future  7. Screening for colon cancer Referral to Dr. Lyndel Safe at [facility] for screening colonoscopy. - Ambulatory referral to Gastroenterology   Hearing/Vision screen No results found.   Depression Screen PHQ 2/9 Scores 02/22/2021 10/22/2020 09/21/2020  PHQ - 2 Score 2 0 0  PHQ- 9 Score 4 0 4    Fall Risk Fall Risk  02/22/2021 10/22/2020 09/21/2020  Falls in the past year? 0 0 0  Number falls in past yr: 0 0 0  Injury with Fall? 0 0 0  Follow up Falls evaluation completed Falls evaluation completed Falls evaluation completed    [place]:  Any stairs in or around the home? No  If so, are there any without handrails? No  Home free of loose throw rugs in walkways, pet beds, electrical  cords, etc? Yes  Adequate lighting in your home to reduce risk of falls? Yes   ASSISTIVE DEVICES UTILIZED TO PREVENT FALLS:  Life alert? No  Use of a cane, walker or w/c? No   Grab bars in the bathroom? Yes  Shower chair or bench in shower? Yes  Elevated toilet seat or a handicapped toilet? Yes   TIMED UP AND GO:  Was the test performed? Yes .  Length of time to ambulate 10 feet: 10 sec.   Gait steady and fast without use of assistive device  Cognitive Function:     6CIT Screen 02/22/2021  What Year? 0 points  What month? 0 points  What time? 0 points  Count back from 20 0 points  Months in reverse 0 points  Repeat phrase 0 points  Total Score 0    Immunizations Immunization History  Administered Date(s) Administered   PFIZER(Purple Top)SARS-COV-2 Vaccination 06/22/2019, 07/13/2019   Td 09/29/2006   Zoster Recombinat (Shingrix) 03/02/2018    TDAP status: Due, Education has been provided regarding the importance of this vaccine. Advised may receive this vaccine at local pharmacy or Health Dept. Aware to provide a copy of the vaccination record if obtained from local pharmacy or Health Dept. Verbalized acceptance and understanding.  Flu Vaccine status: Declined, Education has been provided regarding the importance of this vaccine but patient still declined. Advised may receive this vaccine at local pharmacy or Health Dept. Aware to provide a copy of the vaccination record if obtained from local pharmacy or Health Dept. Verbalized acceptance and understanding.  Pneumococcal vaccine status: Declined,  Education has been provided regarding the importance of this vaccine but patient still declined. Advised may receive this vaccine at local pharmacy or Health Dept. Aware to provide a copy of the vaccination record if obtained from local pharmacy or Health Dept. Verbalized acceptance and understanding.   Covid-19 vaccine status: Completed vaccines  Qualifies for Shingles Vaccine? Yes   Zostavax completed No   Shingrix Completed?: No.    Education has been provided regarding the importance of this vaccine. Patient has been advised to call insurance  company to determine out of pocket expense if they have not yet received this vaccine. Advised may also receive vaccine at local pharmacy or Health Dept. Verbalized acceptance and understanding.  Screening Tests Health Maintenance  Topic Date Due   COLONOSCOPY (Pts 45-42yrs Insurance coverage will need to be confirmed)  Never done   DEXA SCAN  Never done   Zoster Vaccines- Shingrix (2 of 2) 04/27/2018   COVID-19 Vaccine (3 - Booster for Pfizer series) 12/10/2019   INFLUENZA VACCINE  Never done   TETANUS/TDAP  09/21/2021 (Originally 09/28/2016)   Hepatitis C Screening  09/21/2021 (Originally 06/11/1968)   MAMMOGRAM  02/25/2022   HPV VACCINES  Aged Out    Health Maintenance  Health Maintenance Due  Topic Date Due   COLONOSCOPY (Pts 45-67yrs Insurance coverage will need to be confirmed)  Never done   DEXA SCAN  Never done   Zoster Vaccines- Shingrix (2 of 2) 04/27/2018   COVID-19 Vaccine (3 - Booster for Pfizer series) 12/10/2019   INFLUENZA VACCINE  Never done    Colorectal cancer screening: Type of screening: Colonoscopy. Completed 2017. Repeat every 5 years  Mammogram status: Completed 2021. Repeat every year  Bone Density status: Completed 2012. Results reflect: Bone density results: NORMAL. Repeat every 0 years.  Lung Cancer Screening: (Low Dose CT Chest recommended if Age 5-80 years, 30 pack-year currently smoking OR  have quit w/in 15years.) does not qualify.   Lung Cancer Screening Referral: no  Additional Screening:  Hepatitis C Screening: does not qualify; Completed no  Vision Screening: Recommended annual ophthalmology exams for early detection of glaucoma and other disorders of the eye. Is the patient up to date with their annual eye exam?  Yes  Who is the provider or what is the name of the office in which the patient attends annual eye exams? Dr. Josetta [address] If pt is not established with a provider, would they like to be referred to a provider to  establish care? No .   Dental Screening: Recommended annual dental exams for proper oral hygiene  Community Resource Referral / Chronic Care Management: CRR required this visit?  No   CCM required this visit?  No      Plan:     I have personally reviewed and noted the following in the patient's chart:   Medical and social history Use of alcohol, tobacco or illicit drugs  Current medications and supplements including opioid prescriptions.  Functional ability and status Nutritional status Physical activity Advanced directives List of other physicians Hospitalizations, surgeries, and ER visits in previous 12 months Vitals Screenings to include cognitive, depression, and falls Referrals and appointments  In addition, I have reviewed and discussed with patient certain preventive protocols, quality metrics, and best practice recommendations. A written personalized care plan for preventive services as well as general preventive health recommendations were provided to patient.   This note was dictated using Systems analyst. Rapid proofreading was performed to expedite the delivery of the information. Despite proofreading, phonetic errors will occur which are common with this voice recognition software. Please take this into consideration. If there are any concerns, please contact our office.     Leretha Pol, FNP-c 02/22/2021

## 2021-03-09 ENCOUNTER — Ambulatory Visit
Admission: RE | Admit: 2021-03-09 | Discharge: 2021-03-09 | Disposition: A | Discharge status: Home / Self Care | Payer: Medicare Other | Source: Ambulatory Visit | Attending: Nurse Practitioner | Admitting: Nurse Practitioner

## 2021-03-09 ENCOUNTER — Other Ambulatory Visit: Payer: Self-pay

## 2021-03-12 NOTE — Progress Notes (Signed)
Negative mammogram. Result letter sent to [facility].

## 2021-04-27 ENCOUNTER — Encounter: Payer: Self-pay | Admitting: Nurse Practitioner

## 2021-07-28 ENCOUNTER — Other Ambulatory Visit: Payer: Medicare Other

## 2021-07-28 ENCOUNTER — Other Ambulatory Visit: Payer: Self-pay | Admitting: Nurse Practitioner

## 2021-07-28 DIAGNOSIS — Z13820 Encounter for screening for osteoporosis: Secondary | ICD-10-CM

## 2021-07-28 DIAGNOSIS — E2839 Other primary ovarian failure: Secondary | ICD-10-CM

## 2021-08-21 ENCOUNTER — Other Ambulatory Visit: Payer: Self-pay | Admitting: Nurse Practitioner

## 2021-08-21 DIAGNOSIS — I10 Essential (primary) hypertension: Secondary | ICD-10-CM

## 2021-08-23 ENCOUNTER — Ambulatory Visit: Payer: Medicare Other | Admitting: Nurse Practitioner

## 2021-09-15 ENCOUNTER — Telehealth: Payer: Self-pay | Admitting: Nurse Practitioner

## 2021-09-15 ENCOUNTER — Other Ambulatory Visit: Payer: Self-pay | Admitting: Nurse Practitioner

## 2021-09-15 DIAGNOSIS — F5101 Primary insomnia: Secondary | ICD-10-CM

## 2021-09-15 MED ORDER — ZOLPIDEM TARTRATE 10 MG PO TABS: 10.0000 mg | ORAL_TABLET | Freq: Every evening | ORAL | 0 refills | Status: DC | PRN

## 2021-09-15 NOTE — Telephone Encounter (Signed)
I Sent 30 day prescription for ambien to [facility]. She will need to be seen for further refills.

## 2021-09-15 NOTE — Telephone Encounter (Signed)
Called pt Charlotte Blackwell stated that her Rx was sent to pharmacy  and she will need a appt for further refills ?

## 2021-09-15 NOTE — Progress Notes (Signed)
Sent 30 day prescription for ambien to [facility]. Will need to be seen for further refills.  ?

## 2021-09-15 NOTE — Telephone Encounter (Signed)
Patient is requesting refill of Ambien. Please advise.  ?

## 2021-10-07 ENCOUNTER — Ambulatory Visit (INDEPENDENT_AMBULATORY_CARE_PROVIDER_SITE_OTHER): Payer: Medicare Other | Admitting: Nurse Practitioner

## 2021-10-07 ENCOUNTER — Encounter: Payer: Self-pay | Admitting: Nurse Practitioner

## 2021-10-07 VITALS — BP 134/62 | HR 70 | Temp 97.8°F | Ht 66.0 in | Wt 128.8 lb

## 2021-10-07 DIAGNOSIS — E559 Vitamin D deficiency, unspecified: Secondary | ICD-10-CM

## 2021-10-07 DIAGNOSIS — Z0000 Encounter for general adult medical examination without abnormal findings: Secondary | ICD-10-CM | POA: Diagnosis not present

## 2021-10-07 DIAGNOSIS — I10 Essential (primary) hypertension: Secondary | ICD-10-CM

## 2021-10-07 DIAGNOSIS — D1724 Benign lipomatous neoplasm of skin and subcutaneous tissue of left leg: Secondary | ICD-10-CM | POA: Diagnosis not present

## 2021-10-07 DIAGNOSIS — F5101 Primary insomnia: Secondary | ICD-10-CM | POA: Diagnosis not present

## 2021-10-07 MED ORDER — ZOLPIDEM TARTRATE 10 MG PO TABS: 10.0000 mg | ORAL_TABLET | Freq: Every evening | ORAL | 1 refills | Status: DC | PRN

## 2021-10-07 NOTE — Progress Notes (Signed)
Established patient visit   Patient: Charlotte Blackwell   DOB: 1950-11-04   71 y.o. Female  MRN: [id number] Visit Date: 10/07/2021   Chief Complaint  Patient presents with   Medication Refill   Subjective    HPI  Here for follow up.  -history htn. Currently well managed.  -refills needed for ambien. Started having sleeping problems when she started going through menopause. She was about 71 years old.  -denies chest pain, chest pressure, or shortness of breath. She denies headaches or visual disturbances. She denies abdominal pain, nausea, vomiting, or changes in bowel or bladder habits.     Medications: Outpatient Medications Prior to Visit  Medication Sig   lisinopril (ZESTRIL) 20 MG tablet TAKE 1 AND 1/2 TABLETS(30 MG) BY MOUTH AT BEDTIME   meloxicam (MOBIC) 15 MG tablet Take 1 tablet (15 mg total) by mouth daily.   [DISCONTINUED] zolpidem (AMBIEN) 10 MG tablet Take 1 tablet (10 mg total) by mouth at bedtime as needed for sleep.   No facility-administered medications prior to visit.    Review of Systems  Constitutional:  Negative for activity change, appetite change, chills, fatigue and fever.  HENT:  Negative for congestion, postnasal drip, rhinorrhea, sinus pressure, sinus pain, sneezing and sore throat.   Eyes: Negative.   Respiratory:  Negative for cough, chest tightness, shortness of breath and wheezing.   Cardiovascular:  Negative for chest pain and palpitations.  Gastrointestinal:  Negative for abdominal pain, constipation, diarrhea, nausea and vomiting.  Endocrine: Negative for cold intolerance, heat intolerance, polydipsia and polyuria.  Genitourinary:  Negative for dyspareunia, dysuria, flank pain, frequency and urgency.  Musculoskeletal:  Negative for arthralgias, back pain and myalgias.  Skin:  Negative for rash.  Allergic/Immunologic: Negative for environmental allergies.  Neurological:  Negative for dizziness, weakness and headaches.  Hematological:  Negative  for adenopathy.  Psychiatric/Behavioral:  Positive for sleep disturbance. The patient is not nervous/anxious.       Objective     Today's Vitals   10/07/21 1415  BP: 134/62  Pulse: 70  Temp: 97.8 F (36.6 C)  SpO2: 97%  Weight: 128 lb 12.8 oz (58.4 kg)  Height: '5\' 6"'$  (1.676 m)   Body mass index is 20.79 kg/m.   BP Readings from Last 3 Encounters:  10/07/21 134/62  02/22/21 119/66  10/22/20 136/65    Wt Readings from Last 3 Encounters:  10/07/21 128 lb 12.8 oz (58.4 kg)  02/22/21 132 lb 1.6 oz (59.9 kg)  10/22/20 135 lb 6.4 oz (61.4 kg)    Physical Exam Vitals and nursing note reviewed.  Constitutional:      Appearance: Normal appearance. She is well-developed.  HENT:     Head: Normocephalic and atraumatic.  Eyes:     Pupils: Pupils are equal, round, and reactive to light.  Cardiovascular:     Rate and Rhythm: Normal rate and regular rhythm.     Pulses: Normal pulses.     Heart sounds: Normal heart sounds.  Pulmonary:     Effort: Pulmonary effort is normal.     Breath sounds: Normal breath sounds.  Abdominal:     Palpations: Abdomen is soft.  Musculoskeletal:        General: Normal range of motion.     Cervical back: Normal range of motion and neck supple.  Lymphadenopathy:     Cervical: No cervical adenopathy.  Skin:    General: Skin is warm and dry.     Capillary Refill: Capillary refill takes less than  2 seconds.  Neurological:     General: No focal deficit present.     Mental Status: She is alert and oriented to person, place, and time.  Psychiatric:        Mood and Affect: Mood normal.        Behavior: Behavior normal.        Thought Content: Thought content normal.        Judgment: Judgment normal.      Assessment & Plan    1. Essential hypertension Blood pressure stable.  Continue medication as prescribed.  2. Primary insomnia Patient may take Ambien 10 mg at bedtime as needed for acute insomnia.  New prescription sent to her pharmacy. -  zolpidem (AMBIEN) 10 MG tablet; Take 1 tablet (10 mg total) by mouth at bedtime as needed for sleep.  Dispense: 90 tablet; Refill: 1  3. 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  Will monitor and reassess at next visit.  Refer to dermatology as indicated.   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         Ronnell Freshwater, NP  [facility] at [facility] [phone] (phone) [phone] (fax)  [place]

## 2021-10-11 DIAGNOSIS — D1724 Benign lipomatous neoplasm of skin and subcutaneous tissue of left leg: Secondary | ICD-10-CM | POA: Insufficient documentation

## 2021-11-22 IMAGING — DX DG PORTABLE PELVIS
1 series · 1 of 1 positions shown · non-contrast
Comparison: None.

CLINICAL DATA: Right total hip arthroplasty

EXAM:
PORTABLE PELVIS 1-2 VIEWS

[pelvis ap]
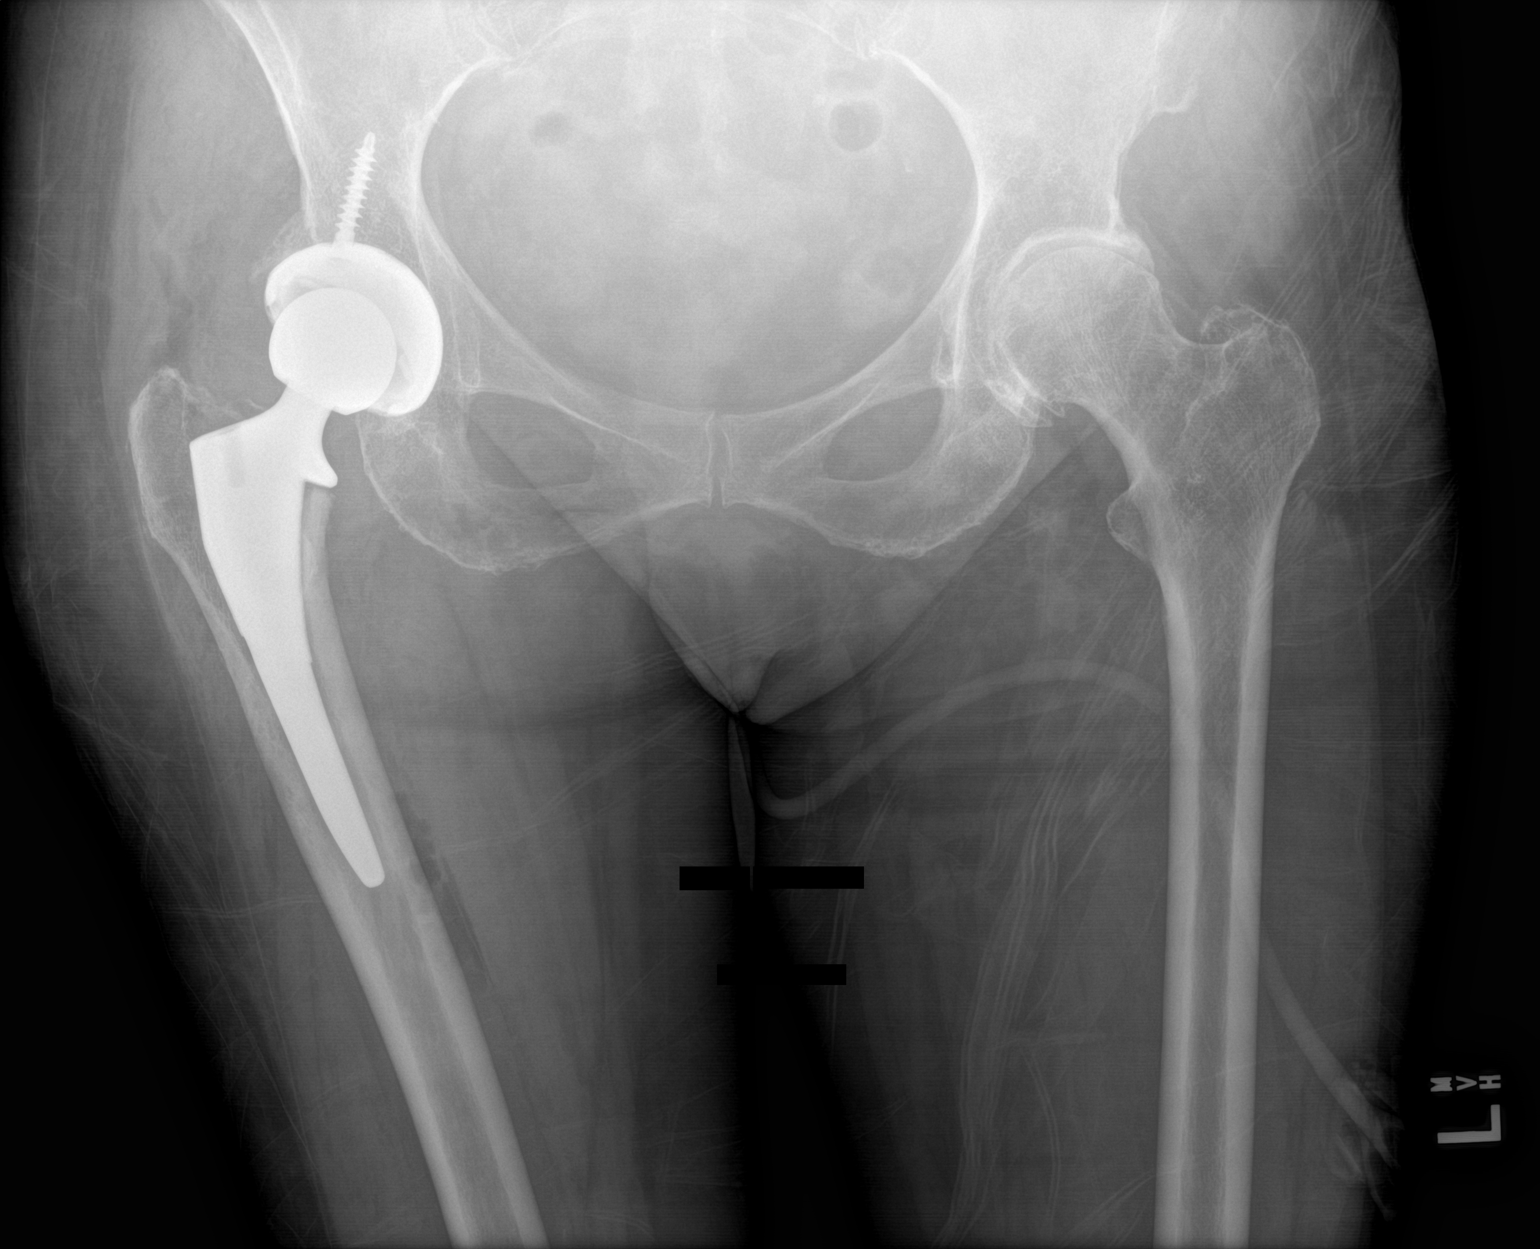

[1 of 1 positions shown; findings below may reference images not displayed]

FINDINGS: Single orthopedic view of the pelvis demonstrates surgical changes
of right total hip arthroplasty. Arthroplasty components overlie the
expected position. No unexpected fracture or dislocation. Moderate
to severe left hip degenerative arthritis is incidentally noted.
IMPRESSION: Status post right total hip arthroplasty. No evidence of fracture or
dislocation.

## 2022-01-07 ENCOUNTER — Ambulatory Visit
Admission: RE | Admit: 2022-01-07 | Discharge: 2022-01-07 | Disposition: A | Discharge status: Home / Self Care | Payer: Medicare Other | Source: Ambulatory Visit | Attending: Nurse Practitioner | Admitting: Nurse Practitioner

## 2022-01-07 DIAGNOSIS — Z13820 Encounter for screening for osteoporosis: Secondary | ICD-10-CM

## 2022-01-07 DIAGNOSIS — E2839 Other primary ovarian failure: Secondary | ICD-10-CM

## 2022-02-14 ENCOUNTER — Other Ambulatory Visit: Payer: Self-pay | Admitting: Nurse Practitioner

## 2022-02-14 DIAGNOSIS — Z1231 Encounter for screening mammogram for malignant neoplasm of breast: Secondary | ICD-10-CM

## 2022-02-17 ENCOUNTER — Other Ambulatory Visit: Payer: Self-pay | Admitting: Nurse Practitioner

## 2022-02-17 DIAGNOSIS — I10 Essential (primary) hypertension: Secondary | ICD-10-CM

## 2022-02-28 ENCOUNTER — Encounter: Payer: Self-pay | Admitting: Nurse Practitioner

## 2022-03-03 ENCOUNTER — Ambulatory Visit
Admission: RE | Admit: 2022-03-03 | Discharge: 2022-03-03 | Disposition: A | Discharge status: Home / Self Care | Payer: Medicare Other | Source: Ambulatory Visit | Attending: Nurse Practitioner | Admitting: Nurse Practitioner

## 2022-03-03 DIAGNOSIS — Z1231 Encounter for screening mammogram for malignant neoplasm of breast: Secondary | ICD-10-CM

## 2022-03-11 ENCOUNTER — Ambulatory Visit
Admission: RE | Admit: 2022-03-11 | Discharge: 2022-03-11 | Disposition: A | Discharge status: Home / Self Care | Payer: Medicare Other | Source: Ambulatory Visit | Attending: Nurse Practitioner | Admitting: Nurse Practitioner

## 2022-03-15 ENCOUNTER — Telehealth: Payer: Self-pay

## 2022-03-15 NOTE — Telephone Encounter (Signed)
Na

## 2022-03-15 NOTE — Progress Notes (Signed)
Negative mammogram. Repeat in one year

## 2022-03-17 ENCOUNTER — Telehealth: Payer: Self-pay

## 2022-03-17 NOTE — Telephone Encounter (Signed)
Error did not send out letter Charlotte Blackwell

## 2022-04-04 ENCOUNTER — Other Ambulatory Visit: Payer: Medicare Other

## 2022-04-04 DIAGNOSIS — F5101 Primary insomnia: Secondary | ICD-10-CM

## 2022-04-04 DIAGNOSIS — I10 Essential (primary) hypertension: Secondary | ICD-10-CM

## 2022-04-04 DIAGNOSIS — Z0000 Encounter for general adult medical examination without abnormal findings: Secondary | ICD-10-CM

## 2022-04-04 DIAGNOSIS — E559 Vitamin D deficiency, unspecified: Secondary | ICD-10-CM

## 2022-04-10 NOTE — Progress Notes (Signed)
Review with patient during visit 04/11/2022

## 2022-04-11 ENCOUNTER — Encounter: Payer: Self-pay | Admitting: Nurse Practitioner

## 2022-04-11 ENCOUNTER — Ambulatory Visit (INDEPENDENT_AMBULATORY_CARE_PROVIDER_SITE_OTHER): Payer: Medicare Other | Admitting: Nurse Practitioner

## 2022-04-11 VITALS — BP 150/74 | HR 67 | Resp 18 | Ht 66.0 in | Wt 130.0 lb

## 2022-04-11 DIAGNOSIS — Z87891 Personal history of nicotine dependence: Secondary | ICD-10-CM

## 2022-04-11 DIAGNOSIS — F17200 Nicotine dependence, unspecified, uncomplicated: Secondary | ICD-10-CM

## 2022-04-11 DIAGNOSIS — F5101 Primary insomnia: Secondary | ICD-10-CM | POA: Diagnosis not present

## 2022-04-11 DIAGNOSIS — I10 Essential (primary) hypertension: Secondary | ICD-10-CM

## 2022-04-11 DIAGNOSIS — M1990 Unspecified osteoarthritis, unspecified site: Secondary | ICD-10-CM | POA: Diagnosis not present

## 2022-04-11 DIAGNOSIS — Z0000 Encounter for general adult medical examination without abnormal findings: Secondary | ICD-10-CM

## 2022-04-11 DIAGNOSIS — Z122 Encounter for screening for malignant neoplasm of respiratory organs: Secondary | ICD-10-CM

## 2022-04-11 MED ORDER — MELOXICAM 15 MG PO TABS: 15.0000 mg | ORAL_TABLET | Freq: Every day | ORAL | 1 refills | Status: DC

## 2022-04-11 MED ORDER — ZOLPIDEM TARTRATE 10 MG PO TABS: 10.0000 mg | ORAL_TABLET | Freq: Every evening | ORAL | 1 refills | Status: DC | PRN

## 2022-04-11 MED ORDER — LISINOPRIL 20 MG PO TABS: ORAL_TABLET | ORAL | 1 refills | Status: DC

## 2022-04-11 NOTE — Progress Notes (Signed)
Subjective:   Charlotte Blackwell is a 71 y.o. female who presents for Medicare Annual (Subsequent) preventive examination. -LDL mildly elevated.  -other labs good  Mammogram 03/15/2022 - negative  Bone density done 01/07/2022 - osteopenia  -?flu shot  -other vaccines from pharmacy  -colon cancer screening   Review of Systems    Review of Systems  Constitutional:  Negative for chills, fever and malaise/fatigue.  HENT:  Negative for congestion, sinus pain and sore throat.   Eyes: Negative.   Respiratory:  Negative for cough, shortness of breath and wheezing.   Cardiovascular:  Negative for chest pain, palpitations and leg swelling.  Gastrointestinal:  Negative for constipation, diarrhea, nausea and vomiting.  Genitourinary: Negative.   Musculoskeletal:  Negative for myalgias.  Skin: Negative.   Neurological:  Negative for dizziness and headaches.  Endo/Heme/Allergies:  Does not bruise/bleed easily.  Psychiatric/Behavioral:  Negative for depression. The patient is not nervous/anxious.           Objective:    Today's Vitals   04/11/22 1422 04/11/22 1423 04/11/22 1506  BP: (Abnormal) 156/75 (Abnormal) 185/70 (Abnormal) 150/74  Pulse: 74  67  Resp: 18    SpO2: 100%    Weight: 130 lb (59 kg)    Height: '5\' 6"'$  (1.676 m)    PainSc: 0-No pain     Body mass index is 20.98 kg/m.    Row Labels 03/03/2020    9:00 PM 02/25/2020    1:36 PM  Advanced Directives   Section Header. No data exists in this row.    Does Patient Have a Medical Advance Directive?   Yes Yes  Type of Advance Directive   Healthcare Power of [place]  Does patient want to make changes to medical advance directive?   No - Patient declined   Copy of [place] in Chart?   Yes - validated most recent copy scanned in chart (See row information) Yes - validated most recent copy scanned in chart (See row information)    Current Medications (verified) Outpatient  Encounter Medications as of 04/11/2022  Medication Sig   [DISCONTINUED] lisinopril (ZESTRIL) 20 MG tablet TAKE 1 AND 1/2 TABLETS(30 MG) BY MOUTH AT BEDTIME   [DISCONTINUED] meloxicam (MOBIC) 15 MG tablet Take 1 tablet (15 mg total) by mouth daily.   [DISCONTINUED] zolpidem (AMBIEN) 10 MG tablet Take 1 tablet (10 mg total) by mouth at bedtime as needed for sleep.   lisinopril (ZESTRIL) 20 MG tablet TAKE 1 AND 1/2 TABLETS(30 MG) BY MOUTH AT BEDTIME   meloxicam (MOBIC) 15 MG tablet Take 1 tablet (15 mg total) by mouth daily.   zolpidem (AMBIEN) 10 MG tablet Take 1 tablet (10 mg total) by mouth at bedtime as needed for sleep.   No facility-administered encounter medications on file as of 04/11/2022.    Allergies (verified) Patient has no known allergies.   History: Past Medical History:  Diagnosis Date   Arthritis    Breast cancer ([place])    Left breast   Hypertension    Personal history of radiation therapy    Past Surgical History:  Procedure Laterality Date   APPENDECTOMY     BREAST LUMPECTOMY Left 2016   COLONOSCOPY     TONSILLECTOMY     TOTAL HIP ARTHROPLASTY Right 03/03/2020   Procedure: TOTAL HIP ARTHROPLASTY ANTERIOR APPROACH;  Surgeon: Paralee Cancel, MD;  Location: [facility];  Service: Orthopedics;  Laterality: Right;  70 mins   WISDOM TOOTH EXTRACTION  Family History  Problem Relation Age of Onset   Breast cancer Neg Hx    Social History   Socioeconomic History   Marital status: Widowed    Spouse name: Not on file   Number of children: Not on file   Years of education: Not on file   Highest education level: Not on file  Occupational History   Not on file  Tobacco Use   Smoking status: Every Day    Packs/day: 1.00    Types: Cigarettes   Smokeless tobacco: Never   Tobacco comments:    Starting smoking at age 23  Vaping Use   Vaping Use: Never used  Substance and Sexual Activity   Alcohol use: Never    Comment: very rare   Drug use: Never   Sexual  activity: Not on file  Other Topics Concern   Not on file  Social History Narrative   Not on file   Social Determinants of Health   Financial Resource Strain: Not on file  Food Insecurity: Not on file  Transportation Needs: Not on file  Physical Activity: Not on file  Stress: Not on file  Social Connections: Not on file   Physical Exam Vitals and nursing note reviewed.  Constitutional:      Appearance: Normal appearance. She is well-developed.  HENT:     Head: Normocephalic and atraumatic.     Right Ear: Tympanic membrane, ear canal and external ear normal.     Left Ear: Tympanic membrane, ear canal and external ear normal.     Nose: Nose normal.     Mouth/Throat:     Mouth: Mucous membranes are moist.     Pharynx: Oropharynx is clear.  Eyes:     Extraocular Movements: Extraocular movements intact.     Conjunctiva/sclera: Conjunctivae normal.     Pupils: Pupils are equal, round, and reactive to light.  Cardiovascular:     Rate and Rhythm: Normal rate and regular rhythm.     Pulses: Normal pulses.     Heart sounds: Normal heart sounds.     No friction rub.  Pulmonary:     Effort: Pulmonary effort is normal.     Breath sounds: Normal breath sounds.  Abdominal:     General: Bowel sounds are normal. There is no distension.     Palpations: Abdomen is soft. There is no mass.     Tenderness: There is no abdominal tenderness. There is no right CVA tenderness, left CVA tenderness, guarding or rebound.     Hernia: No hernia is present.  Musculoskeletal:        General: Normal range of motion.     Cervical back: Normal range of motion and neck supple.  Lymphadenopathy:     Cervical: No cervical adenopathy.  Skin:    General: Skin is warm and dry.     Capillary Refill: Capillary refill takes less than 2 seconds.  Neurological:     General: No focal deficit present.     Mental Status: She is alert and oriented to person, place, and time.  Psychiatric:        Mood and  Affect: Mood normal.        Behavior: Behavior normal.        Thought Content: Thought content normal.        Judgment: Judgment normal.      Tobacco Counseling Ready to quit: Not Answered Counseling given: Not Answered Tobacco comments: Starting smoking at age 72   Clinical Intake:  Pain Score: 0-No pain        How often do you need to have someone help you when you read instructions, pamphlets, or other written materials from your doctor or pharmacy?: (Pended) 1 - Never  Diabetic?No         Activities of Daily Living   Row Labels 04/11/2022    2:24 PM 04/11/2022   12:35 PM  In your present state of health, do you have any difficulty performing the following activities:   Section Header. No data exists in this row.    Hearing?   0 0  Vision?   0 0  Difficulty concentrating or making decisions?   0 0  Walking or climbing stairs?   0 0  Dressing or bathing?   0 0  Doing errands, shopping?   0 0  Preparing Food and eating ?    N  Using the Toilet?    N  In the past six months, have you accidently leaked urine?    N  Do you have problems with loss of bowel control?    N  Managing your Medications?    N  Managing your Finances?    N  Housekeeping or managing your Housekeeping?    N    Patient Care Team: Ronnell Freshwater, NP as PCP - General (Family Medicine)  Indicate any recent Medical Services you may have received from other than [facility] providers in the past year (date may be approximate).     Assessment:   1. Encounter for Medicare annual wellness exam Annual medicare visit today   2. Essential hypertension Increase lisinopril  to 30 mg daily. Follow DASH diet. Drink plenty of water every day.  - lisinopril (ZESTRIL) 20 MG tablet; TAKE 1 AND 1/2 TABLETS(30 MG) BY MOUTH AT BEDTIME  Dispense: 135 tablet; Refill: 1  3. Inflammatory osteoarthritis May take meloxicam 15 mg daily if needed for pain/inflammation.  - meloxicam (MOBIC) 15 MG tablet; Take  1 tablet (15 mg total) by mouth daily.  Dispense: 90 tablet; Refill: 1  4. Primary insomnia May continue ambien 10 mg at bedtime if needed for insomnia.  - zolpidem (AMBIEN) 10 MG tablet; Take 1 tablet (10 mg total) by mouth at bedtime as needed for sleep.  Dispense: 90 tablet; Refill: 1  5. Current every day smoker CT lung cancer screening ordered today  - CT CHEST LUNG CANCER SCREENING LOW DOSE WO CONTRAST; Future  6. Screening for lung cancer CT lung cancer screening ordered today  - CT CHEST LUNG CANCER SCREENING LOW DOSE WO CONTRAST; Future .  Hearing/Vision screen No results found.   Depression Screen   Row Labels 04/11/2022    2:23 PM 10/07/2021    2:17 PM 02/22/2021    1:21 PM 10/22/2020    1:17 PM 09/21/2020    2:53 PM  PHQ 2/9 Scores   Section Header. No data exists in this row.       PHQ - 2 Score   0 0 2 0 0  PHQ- 9 Score   0 2 4 0 4    Fall Risk   Row Labels 04/11/2022    2:24 PM 04/11/2022   12:35 PM 02/22/2021    1:21 PM 10/22/2020    1:18 PM 09/21/2020    2:52 PM  Fall Risk    Section Header. No data exists in this row.       Falls in the past year?   0 0 0 0  0  Number falls in past yr:   0  0 0 0  Injury with Fall?   0  0 0 0  Follow up     Falls evaluation completed Falls evaluation completed Falls evaluation completed    FALL RISK PREVENTION PERTAINING TO THE HOME:  Any stairs in or around the home? No  If so, are there any without handrails? No  Home free of loose throw rugs in walkways, pet beds, electrical cords, etc? Yes  Adequate lighting in your home to reduce risk of falls? Yes   ASSISTIVE DEVICES UTILIZED TO PREVENT FALLS:  Life alert? No  Use of a cane, walker or w/c? No  Grab bars in the bathroom? Yes  Shower chair or bench in shower? Yes  Elevated toilet seat or a handicapped toilet? Yes   TIMED UP AND GO:  Was the test performed? Yes .  Length of time to ambulate 10 feet: 10-15 sec.   Gait steady and fast without use of assistive  device  Cognitive Function:       Row Labels 04/11/2022    2:12 PM 02/22/2021    1:22 PM  6CIT Screen   Section Header. No data exists in this row.    What Year?   0 points 0 points  What month?   0 points 0 points  What time?   0 points 0 points  Count back from 20   0 points 0 points  Months in reverse   0 points 0 points  Repeat phrase   0 points 0 points  Total Score   0 points 0 points    Immunizations Immunization History  Administered Date(s) Administered   PFIZER(Purple Top)SARS-COV-2 Vaccination 06/22/2019, 07/13/2019   Td 09/29/2006   Td,absorbed, Preservative Free, Adult Use, Lf Unspecified 09/29/2006   Zoster Recombinat (Shingrix) 03/02/2018    TDAP status: Up to date  Flu Vaccine status: Declined, Education has been provided regarding the importance of this vaccine but patient still declined. Advised may receive this vaccine at local pharmacy or Health Dept. Aware to provide a copy of the vaccination record if obtained from local pharmacy or Health Dept. Verbalized acceptance and understanding.  Pneumococcal vaccine status: Due, Education has been provided regarding the importance of this vaccine. Advised may receive this vaccine at local pharmacy or Health Dept. Aware to provide a copy of the vaccination record if obtained from local pharmacy or Health Dept. Verbalized acceptance and understanding.  Covid-19 vaccine status: Information provided on how to obtain vaccines.   Qualifies for Shingles Vaccine? Yes   Zostavax completed No   Shingrix Completed?: No.    Education has been provided regarding the importance of this vaccine. Patient has been advised to call insurance company to determine out of pocket expense if they have not yet received this vaccine. Advised may also receive vaccine at local pharmacy or Health Dept. Verbalized acceptance and understanding.  Screening Tests Health Maintenance  Topic Date Due   Pneumonia Vaccine 27+ Years old (1 - PCV)  Never done   Hepatitis C Screening  Never done   COLONOSCOPY (Pts 45-33yr Insurance coverage will need to be confirmed)  Never done   DTaP/Tdap/Td (3 - Tdap) 09/28/2016   Zoster Vaccines- Shingrix (2 of 2) 04/27/2018   COVID-19 Vaccine (3 - 2023-24 season) 01/14/2022   INFLUENZA VACCINE  08/14/2022 (Originally 12/14/2021)   Medicare Annual Wellness (AWV)  04/12/2023   MAMMOGRAM  03/11/2024   DEXA SCAN  Completed  HPV VACCINES  Aged Out    Health Maintenance  Health Maintenance Due  Topic Date Due   Pneumonia Vaccine 58+ Years old (1 - PCV) Never done   Hepatitis C Screening  Never done   COLONOSCOPY (Pts 45-66yr Insurance coverage will need to be confirmed)  Never done   DTaP/Tdap/Td (3 - Tdap) 09/28/2016   Zoster Vaccines- Shingrix (2 of 2) 04/27/2018   COVID-19 Vaccine (3 - 2023-24 season) 01/14/2022    Patient declined colon cancer screening at this time.   Mammogram status: Completed 02/25/2022. Repeat every year  Bone Density status: Completed 01/07/2022. Results reflect: Bone density results: OSTEOPENIA. Repeat every 2 years.  Lung Cancer Screening: (Low Dose CT Chest recommended if Age 73-80years, 30 pack-year currently smoking OR have quit w/in 15years.) does qualify.   Lung Cancer Screening Referral: 04/11/2022   Additional Screening:  Hepatitis C Screening: does not qualify; Completed n/a  Vision Screening: Recommended annual ophthalmology exams for early detection of glaucoma and other disorders of the eye. Is the patient up to date with their annual eye exam?  Yes  Who is the provider or what is the name of the office in which the patient attends annual eye exams? [facility]  If pt is not established with a provider, would they like to be referred to a provider to establish care?  N/a .   Dental Screening: Recommended annual dental exams for proper oral hygiene  Community Resource Referral / Chronic Care Management: CRR required this visit?  No    CCM required this visit?  No      Plan:     I have personally reviewed and noted the following in the patient's chart:   Medical and social history Use of alcohol, tobacco or illicit drugs  Current medications and supplements including opioid prescriptions. Patient is not currently taking opioid prescriptions. Functional ability and status Nutritional status Physical activity Advanced directives List of other physicians Hospitalizations, surgeries, and ER visits in previous 12 months Vitals Screenings to include cognitive, depression, and falls Referrals and appointments  In addition, I have reviewed and discussed with patient certain preventive protocols, quality metrics, and best practice recommendations. A written personalized care plan for preventive services as well as general preventive health recommendations were provided to patient.     HRonnell Freshwater NP   04/17/2022   Nurse Notes: Face to face 20 minutes.

## 2022-04-17 DIAGNOSIS — F17200 Nicotine dependence, unspecified, uncomplicated: Secondary | ICD-10-CM | POA: Insufficient documentation

## 2022-04-17 LAB — COMPREHENSIVE METABOLIC PANEL
ALT: 14 IU/L (ref 0–32)
AST: 17 IU/L (ref 0–40)
Albumin/Globulin Ratio: 2 (ref 1.2–2.2)
Albumin: 4.3 g/dL (ref 3.8–4.8)
Alkaline Phosphatase: 95 IU/L (ref 44–121)
BUN/Creatinine Ratio: 18 (ref 12–28)
BUN: 12 mg/dL (ref 8–27)
Bilirubin Total: 0.4 mg/dL (ref 0.0–1.2)
CO2: 23 mmol/L (ref 20–29)
Calcium: 9.6 mg/dL (ref 8.7–10.3)
Chloride: 103 mmol/L (ref 96–106)
Creatinine, Ser: 0.65 mg/dL (ref 0.57–1.00)
Globulin, Total: 2.2 g/dL (ref 1.5–4.5)
Glucose: 92 mg/dL (ref 70–99)
Potassium: 4.5 mmol/L (ref 3.5–5.2)
Sodium: 140 mmol/L (ref 134–144)
Total Protein: 6.5 g/dL (ref 6.0–8.5)
eGFR: 93 mL/min/{1.73_m2} (ref >59)

## 2022-04-17 LAB — LIPID PANEL
Chol/HDL Ratio: 4.1 ratio (ref 0.0–4.4)
Cholesterol, Total: 171 mg/dL (ref 100–199)
HDL: 42 mg/dL (ref >39)
LDL Chol Calc (NIH): 110 mg/dL — ABNORMAL HIGH (ref 0–99)
Triglycerides: 102 mg/dL (ref 0–149)
VLDL Cholesterol Cal: 19 mg/dL (ref 5–40)

## 2022-04-17 LAB — CBC WITH DIFFERENTIAL/PLATELET
Basophils Absolute: 0 10*3/uL (ref 0.0–0.2)
Basos: 1 %
EOS (ABSOLUTE): 0.2 10*3/uL (ref 0.0–0.4)
Eos: 4 %
Hematocrit: 41.9 % (ref 34.0–46.6)
Hemoglobin: 14.5 g/dL (ref 11.1–15.9)
Immature Grans (Abs): 0 10*3/uL (ref 0.0–0.1)
Immature Granulocytes: 0 %
Lymphocytes Absolute: 2.1 10*3/uL (ref 0.7–3.1)
Lymphs: 31 %
MCH: 31.7 pg (ref 26.6–33.0)
MCHC: 34.6 g/dL (ref 31.5–35.7)
MCV: 92 fL (ref 79–97)
Monocytes Absolute: 0.3 10*3/uL (ref 0.1–0.9)
Monocytes: 5 %
Neutrophils Absolute: 3.9 10*3/uL (ref 1.4–7.0)
Neutrophils: 59 %
Platelets: 202 10*3/uL (ref 150–450)
RBC: 4.57 x10E6/uL (ref 3.77–5.28)
RDW: 12 % (ref 11.7–15.4)
WBC: 6.6 10*3/uL (ref 3.4–10.8)

## 2022-04-17 LAB — VITAMIN D 1,25 DIHYDROXY
Vitamin D 1, 25 (OH)2 Total: 58 pg/mL
Vitamin D2 1, 25 (OH)2: <10 pg/mL
Vitamin D3 1, 25 (OH)2: 57 pg/mL

## 2022-04-17 LAB — T4, FREE: Free T4: 1.3 ng/dL (ref 0.82–1.77)

## 2022-04-17 LAB — TSH: TSH: 2.97 u[IU]/mL (ref 0.450–4.500)

## 2022-04-18 ENCOUNTER — Other Ambulatory Visit: Payer: Self-pay | Admitting: Nurse Practitioner

## 2022-04-18 ENCOUNTER — Telehealth: Payer: Self-pay | Admitting: *Deleted

## 2022-04-18 DIAGNOSIS — I10 Essential (primary) hypertension: Secondary | ICD-10-CM

## 2022-04-18 MED ORDER — LISINOPRIL 20 MG PO TABS: ORAL_TABLET | ORAL | 1 refills | Status: DC

## 2022-04-18 NOTE — Telephone Encounter (Signed)
Pt called with concerns of her BP still being elevated she mentioned it being in the 170s/85.  She wanted to see if she should come in or if there is something else that you would like for her to try.  We scheduled her to come in on 04/21/2022 at 3:50pm so that we would have a spot if she needs to be seen.  Please advise.  Hilaria Titsworth Zimmerman Rumple, CMA

## 2022-04-18 NOTE — Telephone Encounter (Signed)
Please advise the patient to increase her lisinopril to 40 mg (2 tablets) every evening. If no improvement still, she should keep scheduled appointment for 04/21/2022. Thanks  -HB

## 2022-04-19 NOTE — Telephone Encounter (Signed)
LVM requesting a return call.

## 2022-04-19 NOTE — Telephone Encounter (Signed)
Pt informed of below and said that she had already done that and that she would see [place] on Thursday.  Charlotte Blackwell, CMA

## 2022-04-21 ENCOUNTER — Ambulatory Visit (INDEPENDENT_AMBULATORY_CARE_PROVIDER_SITE_OTHER): Payer: Medicare Other | Admitting: Nurse Practitioner

## 2022-04-21 ENCOUNTER — Encounter: Payer: Self-pay | Admitting: Nurse Practitioner

## 2022-04-21 VITALS — BP 150/82 | HR 70 | Resp 18 | Ht 66.0 in | Wt 129.0 lb

## 2022-04-21 DIAGNOSIS — I10 Essential (primary) hypertension: Secondary | ICD-10-CM | POA: Diagnosis not present

## 2022-04-21 MED ORDER — HYDROCHLOROTHIAZIDE 12.5 MG PO TABS: 12.5000 mg | ORAL_TABLET | Freq: Every day | ORAL | 1 refills | Status: DC

## 2022-04-21 NOTE — Progress Notes (Signed)
Established patient visit   Patient: Charlotte Blackwell   DOB: February 06, 1951   71 y.o. Female  MRN: [id number] Visit Date: 04/21/2022   Chief Complaint  Patient presents with   Follow-up   Hypertension   Subjective    Hypertension Pertinent negatives include no chest pain, headaches, palpitations or shortness of breath.    Resistant hypertension  -increased lisinopril to 30 mg when she was seen last Monday  -after that, blood pressure didn't move and actually got higher. Wednesday, the blood pressure was as high as 200/80  -lisinopril was increased to 40 mg --still not helping much, though slightly improved   denies chest pain, chest pressure, or shortness of breath. She denies headaches or visual disturbances. She denies abdominal pain, nausea, vomiting, or changes in bowel or bladder habits.     Medications: Outpatient Medications Prior to Visit  Medication Sig   lisinopril (ZESTRIL) 20 MG tablet TAKE 2 TABLETS (40 MG) BY MOUTH AT BEDTIME   meloxicam (MOBIC) 15 MG tablet Take 1 tablet (15 mg total) by mouth daily.   zolpidem (AMBIEN) 10 MG tablet Take 1 tablet (10 mg total) by mouth at bedtime as needed for sleep.   No facility-administered medications prior to visit.    Review of Systems  Constitutional:  Negative for activity change, appetite change, chills, fatigue and fever.  HENT:  Negative for congestion, postnasal drip, rhinorrhea, sinus pressure, sinus pain, sneezing and sore throat.   Eyes: Negative.   Respiratory:  Negative for cough, chest tightness, shortness of breath and wheezing.   Cardiovascular:  Negative for chest pain and palpitations.       Resistant hypertension   Gastrointestinal:  Negative for abdominal pain, constipation, diarrhea, nausea and vomiting.  Endocrine: Negative for cold intolerance, heat intolerance, polydipsia and polyuria.  Genitourinary:  Negative for dyspareunia, dysuria, flank pain, frequency and urgency.  Musculoskeletal:  Negative  for arthralgias, back pain and myalgias.  Skin:  Negative for rash.  Allergic/Immunologic: Negative for environmental allergies.  Neurological:  Negative for dizziness, weakness and headaches.  Hematological:  Negative for adenopathy.  Psychiatric/Behavioral:  The patient is not nervous/anxious.       Objective     Today's Vitals   04/21/22 1600 04/21/22 1601  BP: (Abnormal) 162/75 (Abnormal) 150/82  Pulse: 70   Resp: 18   SpO2: 98%   Weight: 129 lb (58.5 kg)   Height: '5\' 6"'$  (1.676 m)    Body mass index is 20.82 kg/m.  BP Readings from Last 3 Encounters:  04/28/22 138/69  04/21/22 (Abnormal) 150/82  04/11/22 (Abnormal) 150/74    Wt Readings from Last 3 Encounters:  04/28/22 133 lb 12.8 oz (60.7 kg)  04/21/22 129 lb (58.5 kg)  04/11/22 130 lb (59 kg)    Physical Exam Vitals and nursing note reviewed.  Constitutional:      Appearance: Normal appearance. She is well-developed.  HENT:     Head: Normocephalic and atraumatic.     Nose: Nose normal.     Mouth/Throat:     Mouth: Mucous membranes are moist.     Pharynx: Oropharynx is clear.  Eyes:     Extraocular Movements: Extraocular movements intact.     Conjunctiva/sclera: Conjunctivae normal.     Pupils: Pupils are equal, round, and reactive to light.  Cardiovascular:     Rate and Rhythm: Normal rate and regular rhythm.     Pulses: Normal pulses.     Heart sounds: Normal heart sounds.  Pulmonary:  Effort: Pulmonary effort is normal.     Breath sounds: Normal breath sounds.  Abdominal:     Palpations: Abdomen is soft.  Musculoskeletal:        General: Normal range of motion.     Cervical back: Normal range of motion and neck supple.  Lymphadenopathy:     Cervical: No cervical adenopathy.  Skin:    General: Skin is warm and dry.     Capillary Refill: Capillary refill takes less than 2 seconds.  Neurological:     General: No focal deficit present.     Mental Status: She is alert and oriented to person,  place, and time.  Psychiatric:        Mood and Affect: Mood normal.        Behavior: Behavior normal.        Thought Content: Thought content normal.        Judgment: Judgment normal.       Assessment & Plan    1. Essential hypertension Add HCTZ 12.5 mg tablets daily. Continue lisinopril 40 mg daily. Follow DASH diet and increase water intake. Reassess in 1 week    Problem List Items Addressed This Visit       Cardiovascular and Mediastinum   Essential hypertension - Primary     Return in about 1 week (around 04/28/2022) for blood pressure. added HCTZ 12.5 mg tabs daily .         Ronnell Freshwater, NP  [facility] at [facility] [phone] (phone) [phone] (fax)  [place]

## 2022-04-22 ENCOUNTER — Other Ambulatory Visit: Payer: Self-pay

## 2022-04-22 DIAGNOSIS — I10 Essential (primary) hypertension: Secondary | ICD-10-CM

## 2022-04-22 MED ORDER — HYDROCHLOROTHIAZIDE 12.5 MG PO TABS: 12.5000 mg | ORAL_TABLET | Freq: Every day | ORAL | 0 refills | Status: DC

## 2022-04-25 ENCOUNTER — Inpatient Hospital Stay: Admission: RE | Admit: 2022-04-25 | Payer: Medicare Other | Source: Ambulatory Visit

## 2022-04-28 ENCOUNTER — Ambulatory Visit (INDEPENDENT_AMBULATORY_CARE_PROVIDER_SITE_OTHER): Payer: Medicare Other | Admitting: Nurse Practitioner

## 2022-04-28 ENCOUNTER — Encounter: Payer: Self-pay | Admitting: Nurse Practitioner

## 2022-04-28 VITALS — BP 138/69 | HR 98 | Ht 66.0 in | Wt 133.8 lb

## 2022-04-28 DIAGNOSIS — I10 Essential (primary) hypertension: Secondary | ICD-10-CM

## 2022-04-28 NOTE — Progress Notes (Signed)
Established patient visit   Patient: Charlotte Blackwell   DOB: 1950/11/07   71 y.o. Female  MRN: [id number] Visit Date: 04/28/2022   Chief Complaint  Patient presents with   Follow-up   Subjective    HPI  Follow up  -hypertension  --added low dose HCTZ at most recent visit.  --having no negative side effects. -blood pressure much improved.    Medications: Outpatient Medications Prior to Visit  Medication Sig   hydrochlorothiazide (HYDRODIURIL) 12.5 MG tablet Take 1 tablet (12.5 mg total) by mouth daily.   lisinopril (ZESTRIL) 20 MG tablet TAKE 2 TABLETS (40 MG) BY MOUTH AT BEDTIME   meloxicam (MOBIC) 15 MG tablet Take 1 tablet (15 mg total) by mouth daily.   zolpidem (AMBIEN) 10 MG tablet Take 1 tablet (10 mg total) by mouth at bedtime as needed for sleep.   No facility-administered medications prior to visit.    Review of Systems  Constitutional:  Negative for activity change, appetite change, chills, fatigue and fever.  HENT:  Negative for congestion, postnasal drip, rhinorrhea, sinus pressure, sinus pain, sneezing and sore throat.   Eyes: Negative.   Respiratory:  Negative for cough, chest tightness, shortness of breath and wheezing.   Cardiovascular:  Negative for chest pain and palpitations.       Recent blood pressures have  been elevated. Improved with addition of HCTZ  Gastrointestinal:  Negative for abdominal pain, constipation, diarrhea, nausea and vomiting.  Endocrine: Negative for cold intolerance, heat intolerance, polydipsia and polyuria.  Genitourinary:  Negative for dyspareunia, dysuria, flank pain, frequency and urgency.  Musculoskeletal:  Negative for arthralgias, back pain and myalgias.  Skin:  Negative for rash.  Allergic/Immunologic: Negative for environmental allergies.  Neurological:  Negative for dizziness, weakness and headaches.  Hematological:  Negative for adenopathy.  Psychiatric/Behavioral:  The patient is not nervous/anxious.     Last  CBC Lab Results  Component Value Date   WBC 6.6 04/04/2022   HGB 14.5 04/04/2022   HCT 41.9 04/04/2022   MCV 92 04/04/2022   MCH 31.7 04/04/2022   RDW 12.0 04/04/2022   PLT 202 27/10/2374   Last metabolic panel Lab Results  Component Value Date   GLUCOSE 92 04/04/2022   NA 140 04/04/2022   K 4.5 04/04/2022   CL 103 04/04/2022   CO2 23 04/04/2022   BUN 12 04/04/2022   CREATININE 0.65 04/04/2022   EGFR 93 04/04/2022   CALCIUM 9.6 04/04/2022   PROT 6.5 04/04/2022   ALBUMIN 4.3 04/04/2022   LABGLOB 2.2 04/04/2022   AGRATIO 2.0 04/04/2022   BILITOT 0.4 04/04/2022   ALKPHOS 95 04/04/2022   AST 17 04/04/2022   ALT 14 04/04/2022   ANIONGAP 9 03/04/2020   Last lipids Lab Results  Component Value Date   CHOL 171 04/04/2022   HDL 42 04/04/2022   LDLCALC 110 (H) 04/04/2022   TRIG 102 04/04/2022   CHOLHDL 4.1 04/04/2022   Last hemoglobin A1c No results found for: "HGBA1C" Last thyroid functions Lab Results  Component Value Date   TSH 2.970 04/04/2022        Objective     Today's Vitals   04/28/22 1621  BP: 138/69  Pulse: 98  SpO2: 100%  Weight: 133 lb 12.8 oz (60.7 kg)  Height: _0  (1.676 m)   Body mass index is 21.6 kg/m.  BP Readings from Last 3 Encounters:  04/28/22 138/69  04/21/22 (Abnormal) 150/82  04/11/22 (Abnormal) 150/74    Wt Readings from Last  3 Encounters:  04/28/22 133 lb 12.8 oz (60.7 kg)  04/21/22 129 lb (58.5 kg)  04/11/22 130 lb (59 kg)    Physical Exam Vitals and nursing note reviewed.  Constitutional:      Appearance: Normal appearance. She is well-developed.  HENT:     Head: Normocephalic and atraumatic.     Nose: Nose normal.     Mouth/Throat:     Mouth: Mucous membranes are moist.     Pharynx: Oropharynx is clear.  Eyes:     Extraocular Movements: Extraocular movements intact.     Conjunctiva/sclera: Conjunctivae normal.     Pupils: Pupils are equal, round, and reactive to light.  Cardiovascular:     Rate and  Rhythm: Normal rate and regular rhythm.     Pulses: Normal pulses.     Heart sounds: Normal heart sounds.  Pulmonary:     Effort: Pulmonary effort is normal.     Breath sounds: Normal breath sounds.  Abdominal:     Palpations: Abdomen is soft.  Musculoskeletal:        General: Normal range of motion.     Cervical back: Normal range of motion and neck supple.  Lymphadenopathy:     Cervical: No cervical adenopathy.  Skin:    General: Skin is warm and dry.     Capillary Refill: Capillary refill takes less than 2 seconds.  Neurological:     General: No focal deficit present.     Mental Status: She is alert and oriented to person, place, and time.  Psychiatric:        Mood and Affect: Mood normal.        Behavior: Behavior normal.        Thought Content: Thought content normal.        Judgment: Judgment normal.      Assessment & Plan    1. Essential hypertension Improved and stable. Continue lisinopril and HCTZ as prescribed. Follow DASH diet. Encouraged her to quit smoking. Reassess in three  months.    Problem List Items Addressed This Visit       Cardiovascular and Mediastinum   Essential hypertension - Primary     Return in about 3 months (around 07/28/2022) for blood pressure.         Ronnell Freshwater, NP  [facility] at [facility] [phone] (phone) [phone] (fax)  [place]

## 2022-07-28 ENCOUNTER — Encounter: Payer: Self-pay | Admitting: Nurse Practitioner

## 2022-07-28 ENCOUNTER — Ambulatory Visit (INDEPENDENT_AMBULATORY_CARE_PROVIDER_SITE_OTHER): Payer: Medicare Other | Admitting: Nurse Practitioner

## 2022-07-28 VITALS — BP 137/62 | HR 75 | Ht 66.0 in | Wt 129.4 lb

## 2022-07-28 DIAGNOSIS — F17200 Nicotine dependence, unspecified, uncomplicated: Secondary | ICD-10-CM

## 2022-07-28 DIAGNOSIS — F5101 Primary insomnia: Secondary | ICD-10-CM | POA: Diagnosis not present

## 2022-07-28 DIAGNOSIS — I10 Essential (primary) hypertension: Secondary | ICD-10-CM | POA: Diagnosis not present

## 2022-07-28 NOTE — Progress Notes (Signed)
Established patient visit   Patient: Charlotte Blackwell   DOB: 1950/06/14   72 y.o. Female  MRN: [id number] Visit Date: 07/28/2022   Chief Complaint  Patient presents with   Medical Management of Chronic Issues   Subjective    HPI  Follow up  -hypertension  --bp well managed on current medication  -insomnia  --takes ambien at bedtime to help her sleep  -no concerns or complaints today.  -She denies chest pain, chest pressure, or shortness of breath. She denies headaches or visual disturbances. she denies abdominal pain, nausea, vomiting, or changes in bowel or bladder habits.      Medications: Outpatient Medications Prior to Visit  Medication Sig   hydrochlorothiazide (HYDRODIURIL) 12.5 MG tablet Take 1 tablet (12.5 mg total) by mouth daily.   lisinopril (ZESTRIL) 20 MG tablet TAKE 2 TABLETS (40 MG) BY MOUTH AT BEDTIME   meloxicam (MOBIC) 15 MG tablet Take 1 tablet (15 mg total) by mouth daily.   zolpidem (AMBIEN) 10 MG tablet Take 1 tablet (10 mg total) by mouth at bedtime as needed for sleep.   No facility-administered medications prior to visit.    Review of Systems See HPI    Last CBC Lab Results  Component Value Date   WBC 6.6 04/04/2022   HGB 14.5 04/04/2022   HCT 41.9 04/04/2022   MCV 92 04/04/2022   MCH 31.7 04/04/2022   RDW 12.0 04/04/2022   PLT 202 04/04/2022   Last metabolic panel Lab Results  Component Value Date   GLUCOSE 92 04/04/2022   NA 140 04/04/2022   K 4.5 04/04/2022   CL 103 04/04/2022   CO2 23 04/04/2022   BUN 12 04/04/2022   CREATININE 0.65 04/04/2022   EGFR 93 04/04/2022   CALCIUM 9.6 04/04/2022   PROT 6.5 04/04/2022   ALBUMIN 4.3 04/04/2022   LABGLOB 2.2 04/04/2022   AGRATIO 2.0 04/04/2022   BILITOT 0.4 04/04/2022   ALKPHOS 95 04/04/2022   AST 17 04/04/2022   ALT 14 04/04/2022   ANIONGAP 9 03/04/2020   Last lipids Lab Results  Component Value Date   CHOL 171 04/04/2022   HDL 42 04/04/2022   LDLCALC 110 (H) 04/04/2022    TRIG 102 04/04/2022   CHOLHDL 4.1 04/04/2022    Last thyroid functions Lab Results  Component Value Date   TSH 2.970 04/04/2022        Objective     Today's Vitals   07/28/22 1416 07/28/22 1512  BP: (Abnormal) 141/65 137/62  Pulse: 75   SpO2: 99%   Weight: 129 lb 6.4 oz (58.7 kg)   Height:  (1.676 m)    Body mass index is 20.89 kg/m.  BP Readings from Last 3 Encounters:  07/28/22 137/62  04/28/22 138/69  04/21/22 (Abnormal) 150/82    Wt Readings from Last 3 Encounters:  07/28/22 129 lb 6.4 oz (58.7 kg)  04/28/22 133 lb 12.8 oz (60.7 kg)  04/21/22 129 lb (58.5 kg)    Physical Exam Vitals and nursing note reviewed.  Constitutional:      Appearance: Normal appearance. She is well-developed.  HENT:     Head: Normocephalic and atraumatic.     Nose: Nose normal.     Mouth/Throat:     Mouth: Mucous membranes are moist.     Pharynx: Oropharynx is clear.  Eyes:     Extraocular Movements: Extraocular movements intact.     Conjunctiva/sclera: Conjunctivae normal.     Pupils: Pupils are equal, round, and  reactive to light.  Neck:     Vascular: No carotid bruit.  Cardiovascular:     Rate and Rhythm: Normal rate and regular rhythm.     Pulses: Normal pulses.     Heart sounds: Normal heart sounds.  Pulmonary:     Effort: Pulmonary effort is normal.     Breath sounds: Normal breath sounds.  Abdominal:     Palpations: Abdomen is soft.  Musculoskeletal:        General: Normal range of motion.     Cervical back: Normal range of motion and neck supple.  Lymphadenopathy:     Cervical: No cervical adenopathy.  Skin:    General: Skin is warm and dry.     Capillary Refill: Capillary refill takes less than 2 seconds.  Neurological:     General: No focal deficit present.     Mental Status: She is alert and oriented to person, place, and time.  Psychiatric:        Mood and Affect: Mood normal.        Behavior: Behavior normal.        Thought Content: Thought  content normal.        Judgment: Judgment normal.      Assessment & Plan    1. Essential hypertension Blood pressure stable. Continue current doses lisinopril and HCTZ.   2. Primary insomnia May conitnue to take ambien 10 mg at bedtime if needed. Will fill as needed.   3. Current every day smoker Patient is not ready to quit smoking at this time.    Problem List Items Addressed This Visit       Cardiovascular and Mediastinum   Essential hypertension - Primary     Other   Primary insomnia   Current every day smoker     Return in about 4 months (around 11/27/2022) for blood pressure. needs MWV in november. , FBW a week prior to visit.         Carlean Jews, NP  [facility] at [facility] [phone] (phone) [phone] (fax)  [facility]

## 2022-09-05 LAB — COLOGUARD: COLOGUARD: POSITIVE — AB

## 2022-09-06 ENCOUNTER — Encounter: Payer: Self-pay | Admitting: Gastroenterology

## 2022-09-07 ENCOUNTER — Telehealth: Payer: Self-pay

## 2022-09-07 NOTE — Telephone Encounter (Signed)
Pt is wanting to know if an Urgent referral needed to be sent in since her Cologuard came back positive.   Currently have an appt scheduled for 12/06/22 for Consultation at [facility]. GI

## 2022-09-07 NOTE — Telephone Encounter (Signed)
Called pt LVM to contact the office °

## 2022-09-07 NOTE — Telephone Encounter (Signed)
Called pt LVM to contact the office Per Herbert Seta pt can keep her upcoming appt. with [facility]

## 2022-09-07 NOTE — Telephone Encounter (Signed)
Per Herbert Seta no Urgent referral is needed she can keep that upcoming appt.

## 2022-09-08 NOTE — Telephone Encounter (Signed)
Spoke with patient, informed her of no urgent referral needed, and that she could keep upcoming appointment, patient expressed understanding,thanks.

## 2022-10-06 ENCOUNTER — Telehealth: Payer: Self-pay

## 2022-10-06 NOTE — Telephone Encounter (Signed)
Patient called to see if she could get her AMBIEN called into her pharmacy, please advise, thanks.

## 2022-10-11 ENCOUNTER — Other Ambulatory Visit: Payer: Self-pay | Admitting: Nurse Practitioner

## 2022-10-11 ENCOUNTER — Telehealth: Payer: Self-pay

## 2022-10-11 DIAGNOSIS — F5101 Primary insomnia: Secondary | ICD-10-CM

## 2022-10-11 MED ORDER — ZOLPIDEM TARTRATE 10 MG PO TABS: 10.0000 mg | ORAL_TABLET | Freq: Every evening | ORAL | 1 refills | Status: DC | PRN

## 2022-10-11 NOTE — Telephone Encounter (Signed)
I don't know anything about it being denied. I have sent new prescription to [facility] on [address].

## 2022-10-11 NOTE — Telephone Encounter (Signed)
Patient called about her AMBIEN being denied, please advise, thanks.

## 2022-10-11 NOTE — Telephone Encounter (Signed)
Spoke with patient, patient expressed understanding, thanks.

## 2022-11-28 ENCOUNTER — Ambulatory Visit: Payer: Medicare Other | Admitting: Nurse Practitioner

## 2022-11-28 IMAGING — MG MM DIGITAL SCREENING BILAT W/ TOMO AND CAD
6 of 10 series · 6 of 30 positions shown · non-contrast
Comparison: Previous exam(s).

CLINICAL DATA: Screening.

EXAM:
DIGITAL SCREENING BILATERAL MAMMOGRAM WITH TOMOSYNTHESIS AND CAD
TECHNIQUE: Bilateral screening digital craniocaudal and mediolateral oblique
mammograms were obtained. Bilateral screening digital breast
tomosynthesis was performed. The images were evaluated with
computer-aided detection.

[R MLO synth-2D]
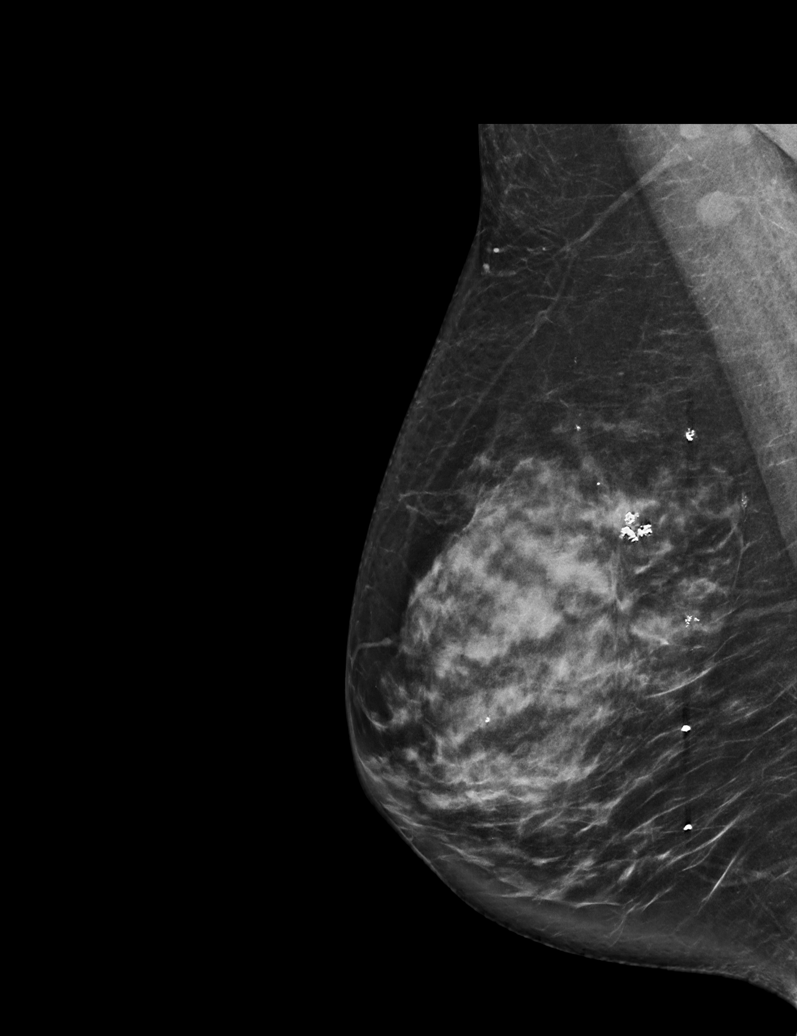

[L CC synth-2D]
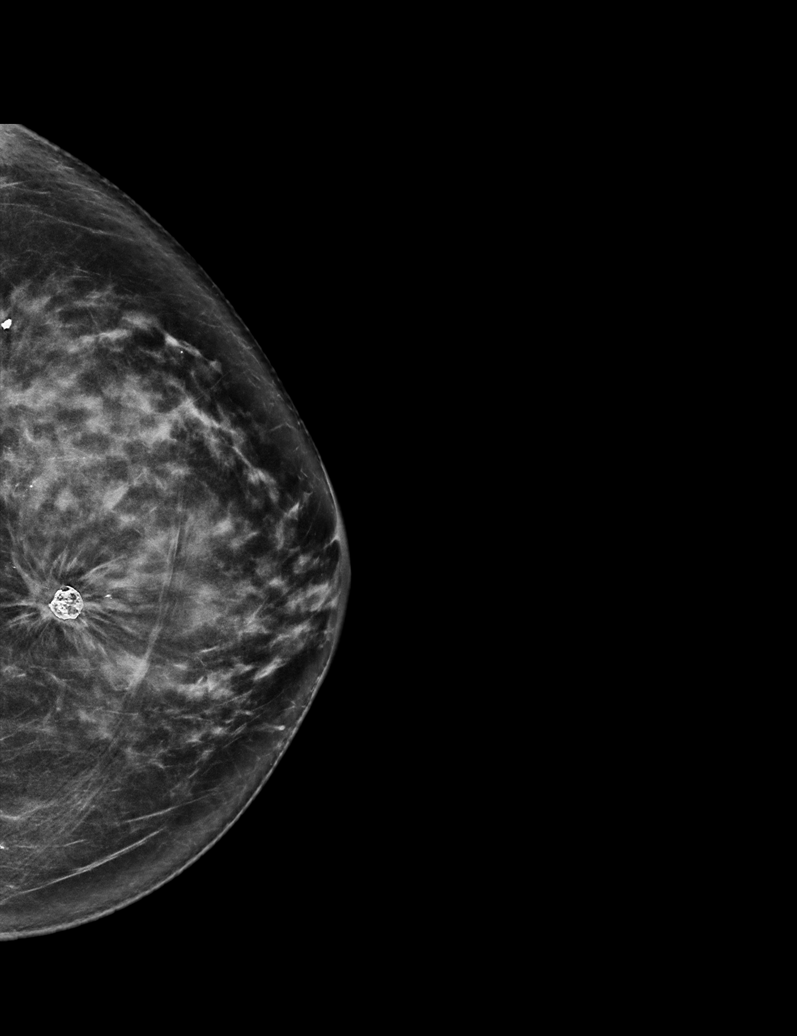

[L MLO synth-2D (1 of 2)]
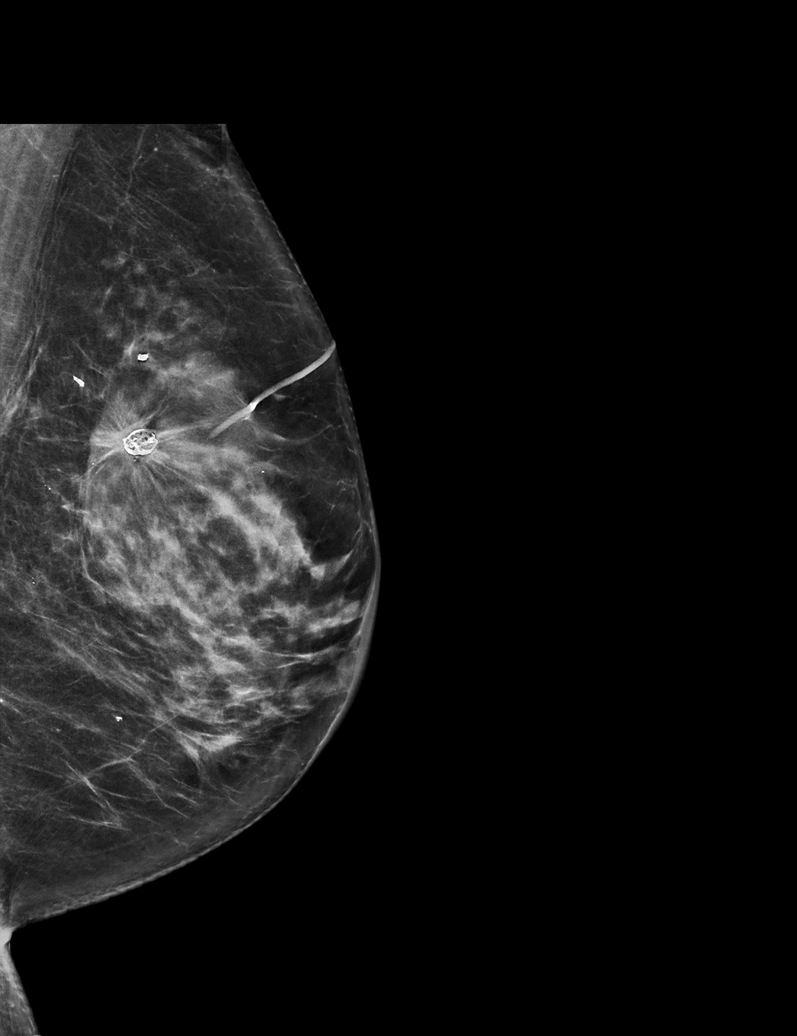

[R CC synth-2D]
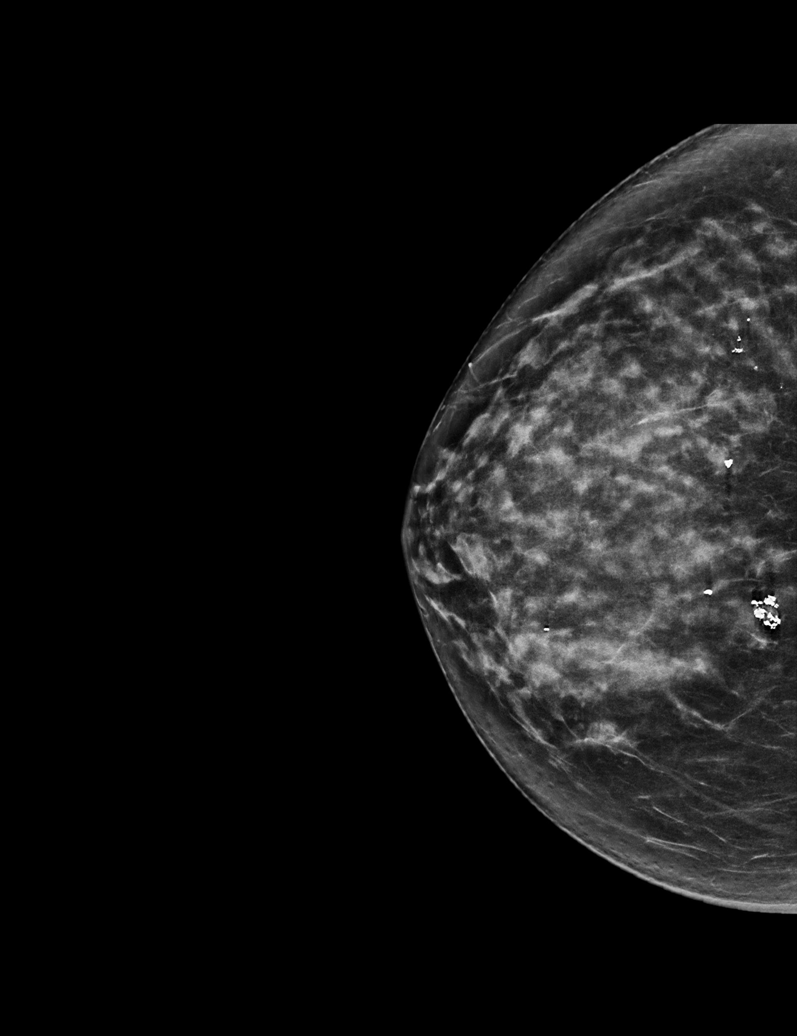

[L MLO synth-2D (2 of 2)]
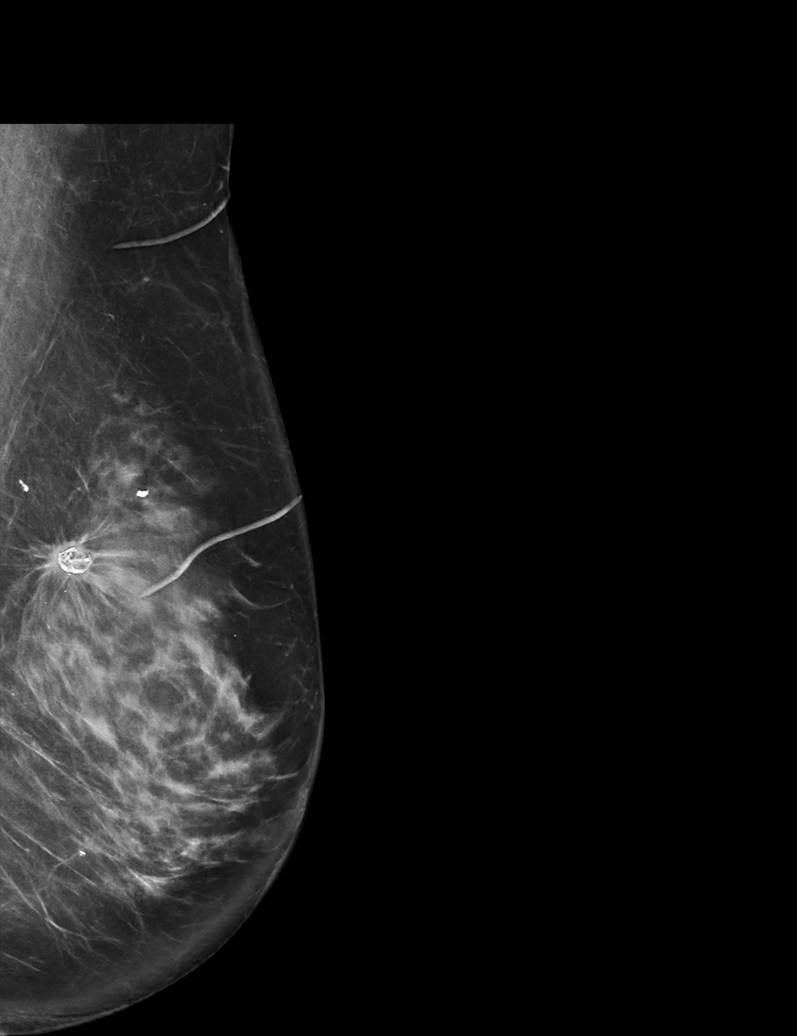

[R CC tomo · tomo slice 33/64.0]
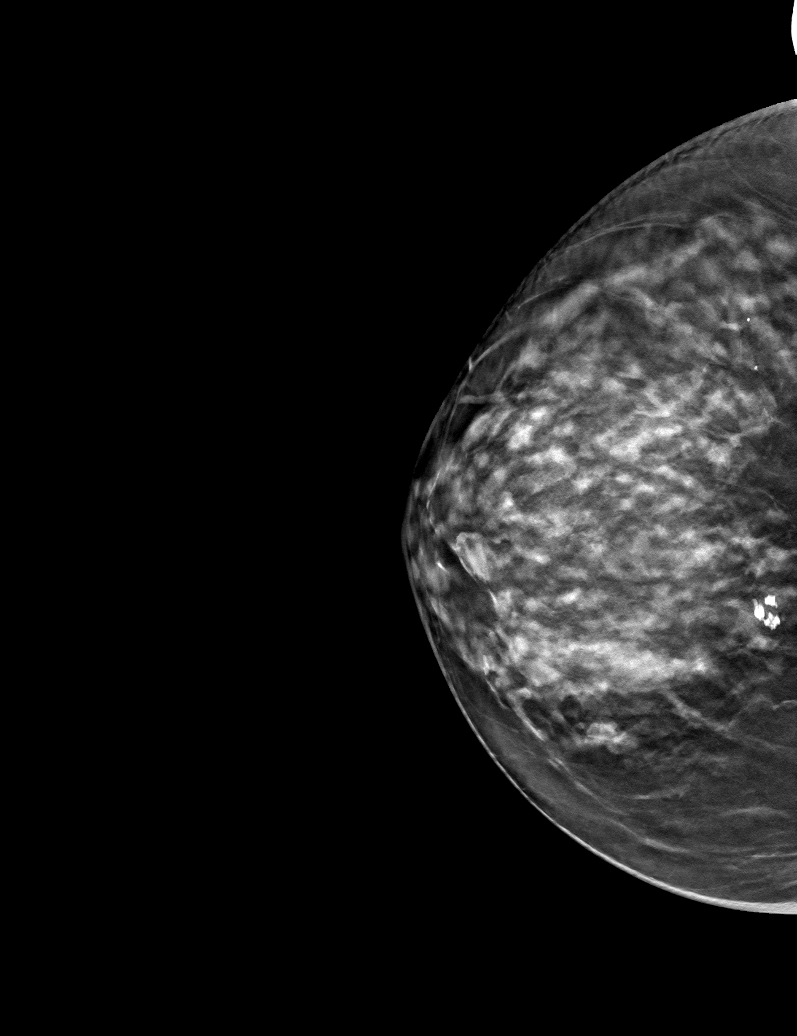

[6 of 30 positions shown; findings below may reference images not displayed]

ACR Breast Density Category c: The breast tissue is heterogeneously
dense, which may obscure small masses.
FINDINGS: There are no findings suspicious for malignancy.
IMPRESSION: No mammographic evidence of malignancy. A result letter of this
screening mammogram will be mailed directly to the patient.

RECOMMENDATION:
Screening mammogram in one year. (Code:Q3-W-BC3)

BI-RADS CATEGORY  1: Negative.

## 2022-12-06 ENCOUNTER — Encounter: Payer: Self-pay | Admitting: Gastroenterology

## 2022-12-06 ENCOUNTER — Ambulatory Visit: Payer: Medicare Other | Admitting: Gastroenterology

## 2022-12-06 VITALS — BP 130/80 | HR 107 | Ht 66.0 in | Wt 136.0 lb

## 2022-12-06 DIAGNOSIS — R195 Other fecal abnormalities: Secondary | ICD-10-CM

## 2022-12-06 DIAGNOSIS — Z86010 Personal history of colonic polyps: Secondary | ICD-10-CM

## 2022-12-06 NOTE — Progress Notes (Signed)
Chief Complaint: For colonoscopy  Referring Provider:  Carlean Jews, NP      ASSESSMENT AND PLAN;   #1. Postive cologuard  #2. H/O polyps  Plan: -Colon ( pref Aug)    Discussed risks & benefits of colonoscopy. Risks including rare perforation req laparotomy, bleeding after bx/polypectomy req blood transfusion, rarely missing neoplasms, risks of anesthesia/sedation, rare risk of damage to internal organs. Benefits outweigh the risks. Patient agrees to proceed. All the questions were answered. Pt consents to proceed.  HPI:    Charlotte Blackwell is a 72 y.o. female   Here to get colonoscopy performed due to positive Cologuard and history of polyps  No nausea, vomiting, heartburn, regurgitation, odynophagia or dysphagia.  No significant diarrhea or constipation.  No melena or hematochezia. No unintentional weight loss. No abdominal pain.    Past GI workup: 2016- colon with polyp involving appendical orfice -[place]- s/p appendectomy /?R hemicolectomy versus cuff resection Repeat colonoscopy 2017- neg. advised to get it repeated in 5 years  Past Medical History:  Diagnosis Date   Arthritis    Breast cancer (HCC)    Left breast   Hypertension    Personal history of radiation therapy     Past Surgical History:  Procedure Laterality Date   APPENDECTOMY     BREAST LUMPECTOMY Left 2016   COLONOSCOPY     TONSILLECTOMY     TOTAL HIP ARTHROPLASTY Right 03/03/2020   Procedure: TOTAL HIP ARTHROPLASTY ANTERIOR APPROACH;  Surgeon: Durene Romans, MD;  Location: [facility];  Service: Orthopedics;  Laterality: Right;  70 mins   WISDOM TOOTH EXTRACTION      Family History  Problem Relation Age of Onset   Breast cancer Neg Hx    Liver disease Neg Hx    Esophageal cancer Neg Hx    Colon cancer Neg Hx     Social History   Tobacco Use   Smoking status: Every Day    Current packs/day: 1.00    Types: Cigarettes   Smokeless tobacco: Never   Tobacco comments:     Starting smoking at age 20  Vaping Use   Vaping status: Never Used  Substance Use Topics   Alcohol use: Never    Comment: very rare   Drug use: Never    Current Outpatient Medications  Medication Sig Dispense Refill   hydrochlorothiazide (HYDRODIURIL) 12.5 MG tablet Take 1 tablet (12.5 mg total) by mouth daily. 90 tablet 0   lisinopril (ZESTRIL) 20 MG tablet TAKE 2 TABLETS (40 MG) BY MOUTH AT BEDTIME 180 tablet 1   meloxicam (MOBIC) 15 MG tablet Take 1 tablet (15 mg total) by mouth daily. 90 tablet 1   zolpidem (AMBIEN) 10 MG tablet Take 1 tablet (10 mg total) by mouth at bedtime as needed for sleep. 90 tablet 1   No current facility-administered medications for this visit.    No Known Allergies  Review of Systems:  Constitutional: Denies fever, chills, diaphoresis, appetite change and fatigue.  HEENT: Denies photophobia, eye pain, redness, hearing loss, ear pain, congestion, sore throat, rhinorrhea, sneezing, mouth sores, neck pain, neck stiffness and tinnitus.   Respiratory: Denies SOB, DOE, cough, chest tightness,  and wheezing.   Cardiovascular: Denies chest pain, palpitations and leg swelling.  Genitourinary: Denies dysuria, urgency, frequency, hematuria, flank pain and difficulty urinating.  Musculoskeletal: Denies myalgias, back pain, joint swelling, arthralgias and gait problem.  Skin: No rash.  Neurological: Denies dizziness, seizures, syncope, weakness, light-headedness, numbness and headaches.  Hematological: Denies adenopathy. Easy bruising, personal or family bleeding history  Psychiatric/Behavioral: No anxiety or depression     Physical Exam:    BP 130/80   Pulse (!) 107   Ht 5\' 6"  (1.676 m)   Wt 136 lb (61.7 kg)   BMI 21.95 kg/m  Wt Readings from Last 3 Encounters:  12/06/22 136 lb (61.7 kg)  07/28/22 129 lb 6.4 oz (58.7 kg)  04/28/22 133 lb 12.8 oz (60.7 kg)   Constitutional:  Well-developed, in no acute distress. Psychiatric: Normal mood and  affect. Behavior is normal. HEENT: Pupils normal.  Conjunctivae are normal. No scleral icterus. Neck supple.  Cardiovascular: Normal rate, regular rhythm. No edema Pulmonary/chest: Effort normal and breath sounds normal. No wheezing, rales or rhonchi. Abdominal: Soft, nondistended. Nontender. Bowel sounds active throughout. There are no masses palpable. No hepatomegaly. Rectal: Deferred Neurological: Alert and oriented to person place and time. Skin: Skin is warm and dry. No rashes noted.  Data Reviewed: I have personally reviewed following labs and imaging studies  CBC:    Latest Ref Rng & Units 04/04/2022   11:00 AM 02/19/2021   11:55 AM 03/04/2020    3:17 AM  CBC  WBC 3.4 - 10.8 x10E3/uL 6.6  7.2  9.7   Hemoglobin 11.1 - 15.9 g/dL 95.2  84.1  32.4   Hematocrit 34.0 - 46.6 % 41.9  45.9  32.8   Platelets 150 - 450 x10E3/uL 202  221  177     CMP:    Latest Ref Rng & Units 04/04/2022   11:00 AM 02/19/2021   11:55 AM 03/04/2020    3:17 AM  CMP  Glucose 70 - 99 mg/dL 92  85  401   BUN 8 - 27 mg/dL 12  14  10    Creatinine 0.57 - 1.00 mg/dL 0.27  2.53  6.64   Sodium 134 - 144 mmol/L 140  140  135   Potassium 3.5 - 5.2 mmol/L 4.5  4.6  3.7   Chloride 96 - 106 mmol/L 103  101  102   CO2 20 - 29 mmol/L 23  22  24    Calcium 8.7 - 10.3 mg/dL 9.6  9.8  8.4   Total Protein 6.0 - 8.5 g/dL 6.5  7.8    Total Bilirubin 0.0 - 1.2 mg/dL 0.4  0.4    Alkaline Phos 44 - 121 IU/L 95  97    AST 0 - 40 IU/L 17  18    ALT 0 - 32 IU/L 14  14         Edman Circle, MD 12/06/2022, 3:10 PM  Cc: Carlean Jews, NP

## 2022-12-06 NOTE — Patient Instructions (Signed)
_______________________________________________________  If your blood pressure at your visit was 140/90 or greater, please contact your primary care physician to follow up on this.  _______________________________________________________  If you are age 74 or older, your body mass index should be between 23-30. Your Body mass index is 21.95 kg/m. If this is out of the aforementioned range listed, please consider follow up with your Primary Care Provider.  If you are age 5 or younger, your body mass index should be between 19-25. Your Body mass index is 21.95 kg/m. If this is out of the aformentioned range listed, please consider follow up with your Primary Care Provider.   ________________________________________________________  The [facility] providers would like to encourage you to use [facility] to communicate with providers for non-urgent requests or questions.  Due to long hold times on the telephone, sending your provider a message by [facility] may be a faster and more efficient way to get a response.  Please allow 48 business hours for a response.  Please remember that this is for non-urgent requests.  _______________________________________________________  We have given you samples of the following medication to take: Clenpiq  You have been scheduled for a colonoscopy. Please follow written instructions given to you at your visit today.   Please pick up your prep supplies at the pharmacy within the next 1-3 days.  If you use inhalers (even only as needed), please bring them with you on the day of your procedure.  DO NOT TAKE 7 DAYS PRIOR TO TEST- Trulicity (dulaglutide) Ozempic, Wegovy (semaglutide) Mounjaro (tirzepatide) Bydureon Bcise (exanatide extended release)  DO NOT TAKE 1 DAY PRIOR TO YOUR TEST Rybelsus (semaglutide) Adlyxin (lixisenatide) Victoza (liraglutide) Byetta (exanatide) ___________________________________________________________________________  Thank  you,  Dr. Lynann Bologna

## 2022-12-07 ENCOUNTER — Encounter: Payer: Self-pay | Admitting: Family Medicine

## 2022-12-07 ENCOUNTER — Ambulatory Visit (INDEPENDENT_AMBULATORY_CARE_PROVIDER_SITE_OTHER): Payer: Medicare Other | Admitting: Family Medicine

## 2022-12-07 VITALS — BP 133/74 | HR 89 | Ht 66.0 in | Wt 136.1 lb

## 2022-12-07 DIAGNOSIS — I10 Essential (primary) hypertension: Secondary | ICD-10-CM | POA: Diagnosis not present

## 2022-12-07 DIAGNOSIS — E785 Hyperlipidemia, unspecified: Secondary | ICD-10-CM | POA: Insufficient documentation

## 2022-12-07 DIAGNOSIS — E782 Mixed hyperlipidemia: Secondary | ICD-10-CM | POA: Diagnosis not present

## 2022-12-07 DIAGNOSIS — L989 Disorder of the skin and subcutaneous tissue, unspecified: Secondary | ICD-10-CM | POA: Diagnosis not present

## 2022-12-07 NOTE — Assessment & Plan Note (Signed)
LDL only mildly elevated at 110.  But ASCVD risk score is over 20% likely driven mostly by age.  Patient age is out of primary prevention recommendations next year.  Discussed this with the patient and pros and cons of starting a medication versus continue to monitor and make dietary modifications. - Patient will like to recheck cholesterol before her next visit in November after making dietary changes.

## 2022-12-07 NOTE — Progress Notes (Signed)
   Established Patient Office Visit  Subjective   Patient ID: Charlotte Blackwell, female    DOB: 1950-10-26  Age: 72 y.o. MRN: [id number]  Chief Complaint  Patient presents with   Medical Management of Chronic Issues    HPI  HTN -patient taking only lisinopril.  No issues with this.  Takes daily.  Skin lesion-patient states she has a bump on her right leg that has been there for about a year.  It is larger.  It is raised.  It is not painful bleeding.  No discharge.  Had a spot on her knee removed several years ago for possible skin cancer.  Used to suntan frequently as a young adult  HLD-discussed with patient her ASCVD risk score which was elevated.  Discussed this in the context of her cholesterol but also her age being the driving factor behind this number.  Discussed how she ages out of primary prevention recommendations in 1 year.  Discussed alternatives to medication such as dietary changes by reducing saturated fat and cholesterol.  Right leg bump - 1 year.  Getting bigger, raised. No pain.  No bleeding or drainage.     The 10-year ASCVD risk score (Arnett DK, et al., 2019) is: 28.7%     ROS    Objective:     BP 133/74   Pulse 89   Ht 5\' 6"  (1.676 m)   Wt 136 lb 1.9 oz (61.7 kg)   SpO2 96%   BMI 21.97 kg/m    Physical Exam General: Alert, oriented Skin: Approximately 4 mm raised flesh-colored, scaly lesion on the posterior medial right calf.   No results found for any visits on 12/07/22.        Assessment & Plan:   Skin lesion Assessment & Plan: Actinic keratosis versus BCC/CCC. 1 month follow-up for shave biopsy.   Essential hypertension Assessment & Plan: Continue lisinopril.  Not taking HCTZ.  Has been removed from her list.   Mixed hyperlipidemia Assessment & Plan: LDL only mildly elevated at 110.  But ASCVD risk score is over 20% likely driven mostly by age.  Patient age is out of primary prevention recommendations next year.  Discussed  this with the patient and pros and cons of starting a medication versus continue to monitor and make dietary modifications. - Patient will like to recheck cholesterol before her next visit in November after making dietary changes.      Return in about 4 weeks (around 01/04/2023) for skin biopsy.    Sandre Kitty, MD

## 2022-12-07 NOTE — Patient Instructions (Addendum)
It was nice to see you today,  We addressed the following topics today: - we discussed your cholesterol levels and dietary changes you can make to improve it.   - we will have you come back for a biopsy of the lesion on your leg.   - we will follow up in November with your lab tests.   Have a great day,  Frederic Jericho, MD

## 2022-12-07 NOTE — Assessment & Plan Note (Signed)
Continue lisinopril.  Not taking HCTZ.  Has been removed from her list.

## 2022-12-07 NOTE — Assessment & Plan Note (Signed)
Actinic keratosis versus BCC/CCC. 1 month follow-up for shave biopsy.

## 2022-12-14 ENCOUNTER — Other Ambulatory Visit: Payer: Self-pay

## 2022-12-14 DIAGNOSIS — I10 Essential (primary) hypertension: Secondary | ICD-10-CM

## 2022-12-14 MED ORDER — LISINOPRIL 20 MG PO TABS: ORAL_TABLET | ORAL | 1 refills | Status: DC

## 2022-12-16 ENCOUNTER — Encounter: Payer: Self-pay | Admitting: Family Medicine

## 2022-12-19 ENCOUNTER — Other Ambulatory Visit: Payer: Self-pay | Admitting: Family Medicine

## 2023-01-04 ENCOUNTER — Other Ambulatory Visit (HOSPITAL_COMMUNITY)
Admission: RE | Admit: 2023-01-04 | Discharge: 2023-01-04 | Disposition: A | Discharge status: Home / Self Care | Payer: Medicare Other | Source: Ambulatory Visit | Attending: Family Medicine | Admitting: Family Medicine

## 2023-01-04 ENCOUNTER — Ambulatory Visit: Payer: Medicare Other | Admitting: Family Medicine

## 2023-01-04 ENCOUNTER — Encounter: Payer: Self-pay | Admitting: Family Medicine

## 2023-01-04 VITALS — BP 119/69 | HR 92 | Ht 66.0 in | Wt 136.0 lb

## 2023-01-04 DIAGNOSIS — D485 Neoplasm of uncertain behavior of skin: Secondary | ICD-10-CM

## 2023-01-04 DIAGNOSIS — L989 Disorder of the skin and subcutaneous tissue, unspecified: Secondary | ICD-10-CM | POA: Diagnosis present

## 2023-01-04 NOTE — Progress Notes (Unsigned)
   Shave Biopsy Procedure Note  Pre-operative Diagnosis: Actinic keratosisr/o SCC/BCC  Post-operative Diagnosis: same  Locations:medial, posterior, right calf  Indications: ***  Anesthesia: Lidocaine 2% with epinephrine without added sodium bicarbonate  Procedure Details  History of allergy to iodine: no  Patient informed of the risks (including bleeding and infection) and benefits of the  procedure and Verbal informed consent obtained.  The lesion and surrounding area were given a sterile prep using alcohol and draped in the usual sterile fashion. A scalpel was used to shave an area of skin approximately 1cm by 1cm.  Hemostasis achieved with direct pressure. Antibiotic ointment and a sterile dressing applied.  The specimen was sent for pathologic examination. The patient tolerated the procedure well.  EBL: 0  ml  Findings: R lower extremity raised flesh colored lesion approximately .4 x .6cm in size removed and sent for pathology   Condition: Stable  Complications: none.  Plan: 1. Instructed to keep the wound dry and covered for 24-48h and clean thereafter. 2. Warning signs of infection were reviewed.   3. Recommended that the patient use OTC analgesics as needed for pain.  4. Return as needed

## 2023-01-04 NOTE — Patient Instructions (Signed)
It was nice to see you today,  We addressed the following topics today: - we will let you know the results of the biopsy when we get them.  - leave the bandage on the rest of the day unless you need to change it due to bleeding.  It should stop bleeding over the next few hours. If it does not let us know.   Have a great day,  Frederic Jericho, MD

## 2023-01-05 LAB — SURGICAL PATHOLOGY

## 2023-01-16 ENCOUNTER — Encounter: Payer: Self-pay | Admitting: Certified Registered Nurse Anesthetist

## 2023-01-20 ENCOUNTER — Ambulatory Visit (AMBULATORY_SURGERY_CENTER): Payer: Medicare Other | Admitting: Gastroenterology

## 2023-01-20 ENCOUNTER — Encounter: Payer: Self-pay | Admitting: Gastroenterology

## 2023-01-20 VITALS — BP 98/52 | HR 63 | Temp 98.4°F | Resp 11 | Ht 66.0 in | Wt 136.0 lb

## 2023-01-20 DIAGNOSIS — K635 Polyp of colon: Secondary | ICD-10-CM | POA: Diagnosis not present

## 2023-01-20 DIAGNOSIS — D124 Benign neoplasm of descending colon: Secondary | ICD-10-CM | POA: Diagnosis not present

## 2023-01-20 DIAGNOSIS — D122 Benign neoplasm of ascending colon: Secondary | ICD-10-CM | POA: Diagnosis not present

## 2023-01-20 DIAGNOSIS — R195 Other fecal abnormalities: Secondary | ICD-10-CM | POA: Diagnosis not present

## 2023-01-20 DIAGNOSIS — D120 Benign neoplasm of cecum: Secondary | ICD-10-CM

## 2023-01-20 DIAGNOSIS — Z1211 Encounter for screening for malignant neoplasm of colon: Secondary | ICD-10-CM | POA: Diagnosis not present

## 2023-01-20 MED ORDER — SODIUM CHLORIDE 0.9 % IV SOLN: 500.0000 mL | Freq: Once | INTRAVENOUS | Status: DC

## 2023-01-20 NOTE — Patient Instructions (Signed)
NO ASPIRIN, ASPIRIN CONTAINING PRODUCTS (BC OR GOODY POWDERS) OR NSAIDS (IBUPROFEN, ADVIL, ALEVE, AND MOTRIN) FOR 5 days; TYLENOL IS OK TO TAKE  YOU HAD AN ENDOSCOPIC PROCEDURE TODAY AT THE [facility]:   Refer to the procedure report that was given to you for any specific questions about what was found during the examination.  If the procedure report does not answer your questions, please call your gastroenterologist to clarify.  If you requested that your care partner not be given the details of your procedure findings, then the procedure report has been included in a sealed envelope for you to review at your convenience later.  YOU SHOULD EXPECT: Some feelings of bloating in the abdomen. Passage of more gas than usual.  Walking can help get rid of the air that was put into your GI tract during the procedure and reduce the bloating. If you had a lower endoscopy (such as a colonoscopy or flexible sigmoidoscopy) you may notice spotting of blood in your stool or on the toilet paper. If you underwent a bowel prep for your procedure, you may not have a normal bowel movement for a few days.  Please Note:  You might notice some irritation and congestion in your nose or some drainage.  This is from the oxygen used during your procedure.  There is no need for concern and it should clear up in a day or so.  SYMPTOMS TO REPORT IMMEDIATELY:  Following lower endoscopy (colonoscopy or flexible sigmoidoscopy):  Excessive amounts of blood in the stool  Significant tenderness or worsening of abdominal pains  Swelling of the abdomen that is new, acute  Fever of 100F or higher  For urgent or emergent issues, a gastroenterologist can be reached at any hour by calling [phone]. Do not use MyChart messaging for urgent concerns.    DIET:  We do recommend a small meal at first, but then you may proceed to your regular diet.  Drink plenty of fluids but you should avoid alcoholic beverages for 24  hours.  ACTIVITY:  You should plan to take it easy for the rest of today and you should NOT DRIVE or use heavy machinery until tomorrow (because of the sedation medicines used during the test).    FOLLOW UP: Our staff will call the number listed on your records the next business day following your procedure.  We will call around 7:15- 8:00 am to check on you and address any questions or concerns that you may have regarding the information given to you following your procedure. If we do not reach you, we will leave a message.     If any biopsies were taken you will be contacted by phone or by letter within the next 1-3 weeks.  Please call us at [phone] if you have not heard about the biopsies in 3 weeks.    SIGNATURES/CONFIDENTIALITY: You and/or your care partner have signed paperwork which will be entered into your electronic medical record.  These signatures attest to the fact that that the information above on your After Visit Summary has been reviewed and is understood.  Full responsibility of the confidentiality of this discharge information lies with you and/or your care-partner.

## 2023-01-20 NOTE — Progress Notes (Unsigned)
Chief Complaint: For colonoscopy  Referring Provider:  Carlean Jews, NP      ASSESSMENT AND PLAN;   #1. Postive cologuard  #2. H/O polyps  Plan: -Colon today    Discussed risks & benefits of colonoscopy. Risks including rare perforation req laparotomy, bleeding after bx/polypectomy req blood transfusion, rarely missing neoplasms, risks of anesthesia/sedation, rare risk of damage to internal organs. Benefits outweigh the risks. Patient agrees to proceed. All the questions were answered. Pt consents to proceed.  HPI:    Charlotte Blackwell is a 72 y.o. female   Here to get colonoscopy performed due to positive Cologuard and history of polyps  No nausea, vomiting, heartburn, regurgitation, odynophagia or dysphagia.  No significant diarrhea or constipation.  No melena or hematochezia. No unintentional weight loss. No abdominal pain.    Past GI workup: 2016- colon with polyp involving appendical orfice -[place], [place]- s/p appendectomy /?R hemicolectomy versus cuff resection Repeat colonoscopy 2017- neg. advised to get it repeated in 5 years  Past Medical History:  Diagnosis Date   Arthritis    Breast cancer (HCC)    Left breast   Cataract    Hypertension    Personal history of radiation therapy     Past Surgical History:  Procedure Laterality Date   APPENDECTOMY     BREAST LUMPECTOMY Left 2016   COLONOSCOPY     TONSILLECTOMY     TOTAL HIP ARTHROPLASTY Right 03/03/2020   Procedure: TOTAL HIP ARTHROPLASTY ANTERIOR APPROACH;  Surgeon: Durene Romans, MD;  Location: [facility];  Service: Orthopedics;  Laterality: Right;  70 mins   WISDOM TOOTH EXTRACTION      Family History  Problem Relation Age of Onset   Breast cancer Neg Hx    Liver disease Neg Hx    Esophageal cancer Neg Hx    Colon cancer Neg Hx     Social History   Tobacco Use   Smoking status: Every Day    Current packs/day: 1.00    Types: Cigarettes   Smokeless tobacco: Never   Tobacco  comments:    Starting smoking at age 60  Vaping Use   Vaping status: Never Used  Substance Use Topics   Alcohol use: Never    Comment: very rare   Drug use: Never    Current Outpatient Medications  Medication Sig Dispense Refill   lisinopril (ZESTRIL) 20 MG tablet TAKE 2 TABLETS (40 MG) BY MOUTH AT BEDTIME 180 tablet 1   zolpidem (AMBIEN) 10 MG tablet Take 1 tablet (10 mg total) by mouth at bedtime as needed for sleep. 90 tablet 1   meloxicam (MOBIC) 15 MG tablet Take 1 tablet (15 mg total) by mouth daily. 90 tablet 1   Current Facility-Administered Medications  Medication Dose Route Frequency Provider Last Rate Last Admin   0.9 %  sodium chloride infusion  500 mL Intravenous Once Lynann Bologna, MD        No Known Allergies  Review of Systems:  Constitutional: Denies fever, chills, diaphoresis, appetite change and fatigue.  HEENT: Denies photophobia, eye pain, redness, hearing loss, ear pain, congestion, sore throat, rhinorrhea, sneezing, mouth sores, neck pain, neck stiffness and tinnitus.   Respiratory: Denies SOB, DOE, cough, chest tightness,  and wheezing.   Cardiovascular: Denies chest pain, palpitations and leg swelling.  Genitourinary: Denies dysuria, urgency, frequency, hematuria, flank pain and difficulty urinating.  Musculoskeletal: Denies myalgias, back pain, joint swelling, arthralgias and gait problem.  Skin: No rash.  Neurological: Denies  dizziness, seizures, syncope, weakness, light-headedness, numbness and headaches.  Hematological: Denies adenopathy. Easy bruising, personal or family bleeding history  Psychiatric/Behavioral: No anxiety or depression     Physical Exam:    BP (!) 177/73   Pulse 78   Temp 98.4 F (36.9 C)   Ht 5\' 6"  (1.676 m)   Wt 136 lb (61.7 kg)   SpO2 95%   BMI 21.95 kg/m  Wt Readings from Last 3 Encounters:  01/20/23 136 lb (61.7 kg)  01/04/23 136 lb (61.7 kg)  12/07/22 136 lb 1.9 oz (61.7 kg)   Constitutional:  Well-developed,  in no acute distress. Psychiatric: Normal mood and affect. Behavior is normal. HEENT: Pupils normal.  Conjunctivae are normal. No scleral icterus. Neck supple.  Cardiovascular: Normal rate, regular rhythm. No edema Pulmonary/chest: Effort normal and breath sounds normal. No wheezing, rales or rhonchi. Abdominal: Soft, nondistended. Nontender. Bowel sounds active throughout. There are no masses palpable. No hepatomegaly. Rectal: Deferred Neurological: Alert and oriented to person place and time. Skin: Skin is warm and dry. No rashes noted.  Data Reviewed: I have personally reviewed following labs and imaging studies  CBC:    Latest Ref Rng & Units 04/04/2022   11:00 AM 02/19/2021   11:55 AM 03/04/2020    3:17 AM  CBC  WBC 3.4 - 10.8 x10E3/uL 6.6  7.2  9.7   Hemoglobin 11.1 - 15.9 g/dL 40.9  81.1  91.4   Hematocrit 34.0 - 46.6 % 41.9  45.9  32.8   Platelets 150 - 450 x10E3/uL 202  221  177     CMP:    Latest Ref Rng & Units 04/04/2022   11:00 AM 02/19/2021   11:55 AM 03/04/2020    3:17 AM  CMP  Glucose 70 - 99 mg/dL 92  85  782   BUN 8 - 27 mg/dL 12  14  10    Creatinine 0.57 - 1.00 mg/dL 9.56  2.13  0.86   Sodium 134 - 144 mmol/L 140  140  135   Potassium 3.5 - 5.2 mmol/L 4.5  4.6  3.7   Chloride 96 - 106 mmol/L 103  101  102   CO2 20 - 29 mmol/L 23  22  24    Calcium 8.7 - 10.3 mg/dL 9.6  9.8  8.4   Total Protein 6.0 - 8.5 g/dL 6.5  7.8    Total Bilirubin 0.0 - 1.2 mg/dL 0.4  0.4    Alkaline Phos 44 - 121 IU/L 95  97    AST 0 - 40 IU/L 17  18    ALT 0 - 32 IU/L 14  14         Edman Circle, MD 01/20/2023, 3:18 PM  Cc: Carlean Jews, NP

## 2023-01-20 NOTE — Op Note (Signed)
[facility] Patient Name: Charlotte Blackwell Procedure Date: 01/20/2023 3:14 PM MRN: [id number] Endoscopist: Lynann Bologna , MD, [id number] Age: 72 Referring MD:  Date of Birth: [birth year] Gender: Female Account #: [id number] Procedure:                Colonoscopy Indications:              Positive Cologuard test Medicines:                Monitored Anesthesia Care Procedure:                Pre-Anesthesia Assessment:                           - Prior to the procedure, a History and Physical                            was performed, and patient medications and                            allergies were reviewed. The patient's tolerance of                            previous anesthesia was also reviewed. The risks                            and benefits of the procedure and the sedation                            options and risks were discussed with the patient.                            All questions were answered, and informed consent                            was obtained. Prior Anticoagulants: The patient has                            taken no anticoagulant or antiplatelet agents. ASA                            Grade Assessment: II - A patient with mild systemic                            disease. After reviewing the risks and benefits,                            the patient was deemed in satisfactory condition to                            undergo the procedure.                           After obtaining informed consent, the colonoscope  was passed under direct vision. Throughout the                            procedure, the patient's blood pressure, pulse, and                            oxygen saturations were monitored continuously. The                            Olympus Scope [id number] was introduced through the                            anus and advanced to the 2 cm into the ileum. The                            colonoscopy was performed without  difficulty. The                            patient tolerated the procedure well. The quality                            of the bowel preparation was good. The terminal                            ileum, ileocecal valve, appendiceal orifice, and                            rectum were photographed. Scope In: 3:27:02 PM Scope Out: 3:48:59 PM Scope Withdrawal Time: 0 hours 16 minutes 58 seconds  Total Procedure Duration: 0 hours 21 minutes 57 seconds  Findings:                 Two sessile polyps were found in the proximal                            ascending colon and cecum. The polyps were 6 to 8                            mm in size. These polyps were removed with a cold                            snare. Resection and retrieval were complete.                           A 15 mm polyp was found in the distal ascending                            colon/hepatic flexure. The polyp was sessile. The                            polyp was removed via underwater EMR cold  polypectomy piecemeal technique. Resection and                            retrieval were complete. Estimated blood loss: none.                           Two sessile polyps were found in the mid sigmoid                            colon and mid descending colon. The polyps were 4                            to 6 mm in size. These polyps were removed with a                            cold snare. Resection and retrieval were complete.                           A few medium-mouthed diverticula were found in the                            sigmoid colon.                           Non-bleeding internal hemorrhoids were found during                            retroflexion. The hemorrhoids were small and Grade                            I (internal hemorrhoids that do not prolapse).                           The terminal ileum appeared normal.                           The exam was otherwise without abnormality on                             direct and retroflexion views. Complications:            No immediate complications. Estimated Blood Loss:     Estimated blood loss: none. Impression:               - Two 6 to 8 mm polyps in the proximal ascending                            colon and in the cecum, removed with a cold snare.                            Resected and retrieved.                           - One 15 mm polyp in the distal ascending/hepatic  flexure colon s/p underwater EMR.                           - Two 4 to 6 mm polyps in the mid sigmoid colon and                            in the mid descending colon, removed with a cold                            snare. Resected and retrieved.                           - Mild sigmoid diverticulosis.                           - Non-bleeding internal hemorrhoids.                           - The examined portion of the ileum was normal.                           - The examination was otherwise normal on direct                            and retroflexion views. Recommendation:           - Patient has a contact number available for                            emergencies. The signs and symptoms of potential                            delayed complications were discussed with the                            patient. Return to normal activities tomorrow.                            Written discharge instructions were provided to the                            patient. Watch for post polypectomy bleeding.                           - Resume previous diet.                           - Continue present medications.                           - No aspirin, ibuprofen, naproxen, or other                            non-steroidal anti-inflammatory drugs for 5 days  after polyp removal.                           - Await pathology results.                           - Repeat colonoscopy for surveillance based on                             pathology results. Likely will require another                            colonoscopy in 1 year.                           - The findings and recommendations were discussed                            with the patient's family. Lynann Bologna, MD 01/20/2023 3:58:09 PM This report has been signed electronically.

## 2023-01-23 ENCOUNTER — Telehealth: Payer: Self-pay

## 2023-01-23 NOTE — Telephone Encounter (Signed)
Attempted to reach patient for post-procedure f/u call. No answer. Left message for her to please not hesitate to call if she has any questions/concerns regarding her care. 

## 2023-01-25 ENCOUNTER — Encounter: Payer: Self-pay | Admitting: Gastroenterology

## 2023-01-25 LAB — SURGICAL PATHOLOGY

## 2023-03-09 ENCOUNTER — Ambulatory Visit (INDEPENDENT_AMBULATORY_CARE_PROVIDER_SITE_OTHER): Payer: Medicare Other | Admitting: Family Medicine

## 2023-03-09 VITALS — BP 120/66 | HR 78 | Ht 66.0 in | Wt 135.4 lb

## 2023-03-09 DIAGNOSIS — J069 Acute upper respiratory infection, unspecified: Secondary | ICD-10-CM | POA: Diagnosis not present

## 2023-03-09 NOTE — Assessment & Plan Note (Signed)
8 days of symptoms including nasal congestion, rhinorrhea, sneezing.  No concern for bacterial sinusitis. - Flonase, guaifenesin, nasal saline as needed - Advised to let us know if nasal congestion/symptoms get worse or do not improve in a week.

## 2023-03-09 NOTE — Patient Instructions (Signed)
It was nice to see you today,  We addressed the following topics today: -You appear to have a viral upper respiratory infection causing your nasal congestion. - I would recommend using nasal saline sprays that you can find over-the-counter.  You can also try Mucinex 12-hour which contains guaifenesin.  Continue to use the Flonase as well. - If symptoms are not improving in the next week let us know.  Have a great day,  Frederic Jericho, MD

## 2023-03-09 NOTE — Progress Notes (Signed)
   Acute Office Visit  Subjective:     Patient ID: Tamyiah Rosengarten, female    DOB: 1950/06/25, 72 y.o.   MRN: [id number]  Chief Complaint  Patient presents with   Nasal Congestion    HPI Patient is in today for URI symptoms  Patient states her symptoms started last Tuesday.  Started with cough you had, sneezing, runny nose.  No cough.  No issues with hearing.  No sore throat.  No nausea vomiting diarrhea.  States she had a "mild fever" of 99.  Has not been around a bit has been sick.  She has tried daily Afrin nasal decongestion and then stopped 2 days ago and started taking Flonase.  She also tried a oral decongestant such as Sudafed but stopped when she read this was not effective.  Has not tried any other treatments.  ROS      Objective:    BP 120/66   Pulse 78   Ht 5\' 6"  (1.676 m)   Wt 135 lb 6.4 oz (61.4 kg)   SpO2 99%   BMI 21.85 kg/m    Physical Exam General: Alert, oriented HEENT: Normal oral mucosa.  Nasal turbinates inflamed/enlarged bilaterally.  No frontal or maxillary sinus tenderness CV: Regular rate rhythm Pulmonary: Lungs clear bilaterally  No results found for any visits on 03/09/23.      Assessment & Plan:   Viral URI Assessment & Plan: 8 days of symptoms including nasal congestion, rhinorrhea, sneezing.  No concern for bacterial sinusitis. - Flonase, guaifenesin, nasal saline as needed - Advised to let us know if nasal congestion/symptoms get worse or do not improve in a week.      Return for Patient needs annual wellness visit scheduled.  Sandre Kitty, MD

## 2023-03-29 ENCOUNTER — Encounter: Payer: Self-pay | Admitting: Nurse Practitioner

## 2023-03-29 DIAGNOSIS — Z0000 Encounter for general adult medical examination without abnormal findings: Secondary | ICD-10-CM

## 2023-03-30 ENCOUNTER — Other Ambulatory Visit: Payer: Self-pay | Admitting: Family Medicine

## 2023-03-30 DIAGNOSIS — Z1231 Encounter for screening mammogram for malignant neoplasm of breast: Secondary | ICD-10-CM

## 2023-04-03 ENCOUNTER — Other Ambulatory Visit: Payer: Self-pay | Admitting: Family Medicine

## 2023-04-03 DIAGNOSIS — Z0000 Encounter for general adult medical examination without abnormal findings: Secondary | ICD-10-CM

## 2023-04-03 DIAGNOSIS — I10 Essential (primary) hypertension: Secondary | ICD-10-CM

## 2023-04-03 DIAGNOSIS — E782 Mixed hyperlipidemia: Secondary | ICD-10-CM

## 2023-04-06 ENCOUNTER — Other Ambulatory Visit: Payer: Self-pay | Admitting: Family Medicine

## 2023-04-06 DIAGNOSIS — F5101 Primary insomnia: Secondary | ICD-10-CM

## 2023-04-06 MED ORDER — ZOLPIDEM TARTRATE 10 MG PO TABS: 10.0000 mg | ORAL_TABLET | Freq: Every evening | ORAL | 1 refills | Status: DC | PRN

## 2023-04-11 ENCOUNTER — Other Ambulatory Visit: Payer: Medicare Other

## 2023-04-11 DIAGNOSIS — E782 Mixed hyperlipidemia: Secondary | ICD-10-CM

## 2023-04-11 DIAGNOSIS — Z0000 Encounter for general adult medical examination without abnormal findings: Secondary | ICD-10-CM

## 2023-04-11 DIAGNOSIS — I10 Essential (primary) hypertension: Secondary | ICD-10-CM

## 2023-04-12 LAB — COMPREHENSIVE METABOLIC PANEL
ALT: 15 [IU]/L (ref 0–32)
AST: 19 [IU]/L (ref 0–40)
Albumin: 4.4 g/dL (ref 3.8–4.8)
Alkaline Phosphatase: 97 [IU]/L (ref 44–121)
BUN/Creatinine Ratio: 28 (ref 12–28)
BUN: 16 mg/dL (ref 8–27)
Bilirubin Total: 0.4 mg/dL (ref 0.0–1.2)
CO2: 23 mmol/L (ref 20–29)
Calcium: 9.5 mg/dL (ref 8.7–10.3)
Chloride: 104 mmol/L (ref 96–106)
Creatinine, Ser: 0.57 mg/dL (ref 0.57–1.00)
Globulin, Total: 2.6 g/dL (ref 1.5–4.5)
Glucose: 88 mg/dL (ref 70–99)
Potassium: 4.3 mmol/L (ref 3.5–5.2)
Sodium: 142 mmol/L (ref 134–144)
Total Protein: 7 g/dL (ref 6.0–8.5)
eGFR: 95 mL/min/{1.73_m2} (ref >59)

## 2023-04-12 LAB — CBC WITH DIFFERENTIAL/PLATELET
Basophils Absolute: 0 10*3/uL (ref 0.0–0.2)
Basos: 1 %
EOS (ABSOLUTE): 0.2 10*3/uL (ref 0.0–0.4)
Eos: 3 %
Hematocrit: 45.6 % (ref 34.0–46.6)
Hemoglobin: 14.7 g/dL (ref 11.1–15.9)
Immature Grans (Abs): 0 10*3/uL (ref 0.0–0.1)
Immature Granulocytes: 0 %
Lymphocytes Absolute: 2 10*3/uL (ref 0.7–3.1)
Lymphs: 30 %
MCH: 30.3 pg (ref 26.6–33.0)
MCHC: 32.2 g/dL (ref 31.5–35.7)
MCV: 94 fL (ref 79–97)
Monocytes Absolute: 0.4 10*3/uL (ref 0.1–0.9)
Monocytes: 6 %
Neutrophils Absolute: 4 10*3/uL (ref 1.4–7.0)
Neutrophils: 60 %
Platelets: 231 10*3/uL (ref 150–450)
RBC: 4.85 x10E6/uL (ref 3.77–5.28)
RDW: 11.9 % (ref 11.7–15.4)
WBC: 6.6 10*3/uL (ref 3.4–10.8)

## 2023-04-12 LAB — LIPID PANEL
Chol/HDL Ratio: 4.7 {ratio} — ABNORMAL HIGH (ref 0.0–4.4)
Cholesterol, Total: 199 mg/dL (ref 100–199)
HDL: 42 mg/dL (ref >39)
LDL Chol Calc (NIH): 136 mg/dL — ABNORMAL HIGH (ref 0–99)
Triglycerides: 118 mg/dL (ref 0–149)
VLDL Cholesterol Cal: 21 mg/dL (ref 5–40)

## 2023-04-17 ENCOUNTER — Encounter: Payer: Medicare Other | Admitting: Nurse Practitioner

## 2023-04-18 ENCOUNTER — Ambulatory Visit (INDEPENDENT_AMBULATORY_CARE_PROVIDER_SITE_OTHER): Payer: Medicare Other

## 2023-04-18 DIAGNOSIS — Z0000 Encounter for general adult medical examination without abnormal findings: Secondary | ICD-10-CM | POA: Diagnosis not present

## 2023-04-18 NOTE — Progress Notes (Signed)
Subjective:   Charlotte Blackwell is a 72 y.o. female who presents for Medicare Annual (Subsequent) preventive examination.  Visit Complete: Virtual I connected with  Rachael Carr Carcamo on 04/18/23 by a audio enabled telemedicine application and verified that I am speaking with the correct person using two identifiers.  Patient Location: Home  Provider Location: Office/Clinic  I discussed the limitations of evaluation and management by telemedicine. The patient expressed understanding and agreed to proceed.  Vital Signs: Because this visit was a virtual/telehealth visit, some criteria may be missing or patient reported. Any vitals not documented were not able to be obtained and vitals that have been documented are patient reported.  Patient Medicare AWV questionnaire was completed by the patient on 04/15/2023; I have confirmed that all information answered by patient is correct and no changes since this date.  Cardiac Risk Factors include: advanced age (>30men, >44 women);dyslipidemia;hypertension     Objective:    Today's Vitals   There is no height or weight on file to calculate BMI.     04/18/2023    1:30 PM 03/03/2020    9:00 PM 02/25/2020    1:36 PM  Advanced Directives  Does Patient Have a Medical Advance Directive? Yes Yes Yes  Type of Estate agent of [place];Living will Healthcare Power of [employer] Power of Attorney  Does patient want to make changes to medical advance directive?  No - Patient declined   Copy of Healthcare Power of Attorney in Chart? Yes - validated most recent copy scanned in chart (See row information) Yes - validated most recent copy scanned in chart (See row information) Yes - validated most recent copy scanned in chart (See row information)    Current Medications (verified) Outpatient Encounter Medications as of 04/18/2023  Medication Sig   lisinopril (ZESTRIL) 20 MG tablet TAKE 2 TABLETS (40 MG) BY MOUTH AT  BEDTIME   meloxicam (MOBIC) 15 MG tablet Take 1 tablet (15 mg total) by mouth daily. (Patient taking differently: Take 15 mg by mouth daily. As needed)   zolpidem (AMBIEN) 10 MG tablet Take 1 tablet (10 mg total) by mouth at bedtime as needed for sleep.   No facility-administered encounter medications on file as of 04/18/2023.    Allergies (verified) Patient has no known allergies.   History: Past Medical History:  Diagnosis Date   Arthritis    Breast cancer (HCC)    Left breast   Cataract    Hypertension    Personal history of radiation therapy    Past Surgical History:  Procedure Laterality Date   APPENDECTOMY     BREAST LUMPECTOMY Left 2016   COLONOSCOPY     TONSILLECTOMY     TOTAL HIP ARTHROPLASTY Right 03/03/2020   Procedure: TOTAL HIP ARTHROPLASTY ANTERIOR APPROACH;  Surgeon: Durene Romans, MD;  Location: [facility];  Service: Orthopedics;  Laterality: Right;  70 mins   WISDOM TOOTH EXTRACTION     Family History  Problem Relation Age of Onset   Breast cancer Neg Hx    Liver disease Neg Hx    Esophageal cancer Neg Hx    Colon cancer Neg Hx    Social History   Socioeconomic History   Marital status: Widowed    Spouse name: Not on file   Number of children: 0   Years of education: Not on file   Highest education level: 12th grade  Occupational History   Occupation: retired  Tobacco Use   Smoking status: Every Day  Current packs/day: 1.00    Types: Cigarettes   Smokeless tobacco: Never   Tobacco comments:    Starting smoking at age 36  Vaping Use   Vaping status: Never Used  Substance and Sexual Activity   Alcohol use: Never    Comment: very rare   Drug use: Never   Sexual activity: Not on file  Other Topics Concern   Not on file  Social History Narrative   Not on file   Social Determinants of Health   Financial Resource Strain: Low Risk  (04/15/2023)   Overall Financial Resource Strain (CARDIA)    Difficulty of Paying Living Expenses: Not hard at  all  Food Insecurity: No Food Insecurity (04/15/2023)   Hunger Vital Sign    Worried About Running Out of Food in the Last Year: Never true    Ran Out of Food in the Last Year: Never true  Transportation Needs: No Transportation Needs (04/15/2023)   PRAPARE - Administrator, Civil Service (Medical): No    Lack of Transportation (Non-Medical): No  Physical Activity: Insufficiently Active (04/15/2023)   Exercise Vital Sign    Days of Exercise per Week: 2 days    Minutes of Exercise per Session: 20 min  Stress: No Stress Concern Present (04/15/2023)   [employer] of Occupational Health - Occupational Stress Questionnaire    Feeling of Stress : Only a little  Social Connections: Moderately Integrated (04/15/2023)   Social Connection and Isolation Panel [NHANES]    Frequency of Communication with Friends and Family: More than three times a week    Frequency of Social Gatherings with Friends and Family: Once a week    Attends Religious Services: More than 4 times per year    Active Member of [employer] or Organizations: Yes    Attends Banker Meetings: More than 4 times per year    Marital Status: Widowed    Tobacco Counseling Ready to quit: No Counseling given: Not Answered Tobacco comments: Starting smoking at age 8   Clinical Intake:  Pre-visit preparation completed: Yes  Pain : No/denies pain     Nutritional Risks: None Diabetes: No  How often do you need to have someone help you when you read instructions, pamphlets, or other written materials from your doctor or pharmacy?: 1 - Never  Interpreter Needed?: No  Information entered by :: NAllen LPN   Activities of Daily Living    04/15/2023   10:49 PM 04/28/2022    4:23 PM  In your present state of health, do you have any difficulty performing the following activities:  Hearing? 0 0  Vision? 0 0  Difficulty concentrating or making decisions? 0 0  Walking or climbing stairs? 0 0   Dressing or bathing? 0 0  Doing errands, shopping? 0 0  Preparing Food and eating ? N   Using the Toilet? N   In the past six months, have you accidently leaked urine? Y   Comment with sneeze   Do you have problems with loss of bowel control? N   Managing your Medications? N   Managing your Finances? N   Housekeeping or managing your Housekeeping? N     Patient Care Team: Sandre Kitty, MD as PCP - General (Family Medicine)  Indicate any recent Medical Services you may have received from other than [facility] providers in the past year (date may be approximate).     Assessment:   This is a routine wellness examination for Charlotte Blackwell.  Hearing/Vision screen Hearing Screening - Comments:: Denies hearing issues Vision Screening - Comments:: No regular eye exams   Goals Addressed             This Visit's Progress    Patient Stated       04/18/2023, continue to eat healthy       Depression Screen    04/18/2023    1:31 PM 03/09/2023   11:02 AM 12/07/2022    2:57 PM 07/28/2022    2:21 PM 04/28/2022    4:23 PM 04/21/2022    4:01 PM 04/11/2022    2:23 PM  PHQ 2/9 Scores  PHQ - 2 Score 0 0 0 0 0 0 0  PHQ- 9 Score 2 2  1  0 0 0    Fall Risk    04/15/2023   10:49 PM 07/28/2022    2:21 PM 04/21/2022    4:01 PM 04/11/2022    2:24 PM 04/11/2022   12:35 PM  Fall Risk   Falls in the past year? 0 0 0 0 0  Number falls in past yr: 0 0 0 0   Injury with Fall? 0 0 0 0   Risk for fall due to : Medication side effect      Follow up Falls prevention discussed;Falls evaluation completed Falls evaluation completed       MEDICARE RISK AT HOME: Medicare Risk at Home Any stairs in or around the home?: No If so, are there any without handrails?: No Home free of loose throw rugs in walkways, pet beds, electrical cords, etc?: No Adequate lighting in your home to reduce risk of falls?: Yes Life alert?: No Use of a cane, walker or w/c?: No Grab bars in the bathroom?: Yes Shower chair or  bench in shower?: Yes Elevated toilet seat or a handicapped toilet?: Yes  TIMED UP AND GO:  Was the test performed?  No    Cognitive Function:        04/18/2023    1:32 PM 04/11/2022    2:12 PM 02/22/2021    1:22 PM  6CIT Screen  What Year? 0 points 0 points 0 points  What month? 0 points 0 points 0 points  What time? 0 points 0 points 0 points  Count back from 20 0 points 0 points 0 points  Months in reverse 0 points 0 points 0 points  Repeat phrase 0 points 0 points 0 points  Total Score 0 points 0 points 0 points    Immunizations Immunization History  Administered Date(s) Administered   PFIZER(Purple Top)SARS-COV-2 Vaccination 06/22/2019, 07/13/2019, 04/22/2020   Td 09/29/2006   Td,absorbed, Preservative Free, Adult Use, Lf Unspecified 09/29/2006   Zoster Recombinant(Shingrix) 12/21/2017, 03/02/2018    TDAP status: Due, Education has been provided regarding the importance of this vaccine. Advised may receive this vaccine at local pharmacy or Health Dept. Aware to provide a copy of the vaccination record if obtained from local pharmacy or Health Dept. Verbalized acceptance and understanding.  Flu Vaccine status: Declined, Education has been provided regarding the importance of this vaccine but patient still declined. Advised may receive this vaccine at local pharmacy or Health Dept. Aware to provide a copy of the vaccination record if obtained from local pharmacy or Health Dept. Verbalized acceptance and understanding.  Pneumococcal vaccine status: Declined,  Education has been provided regarding the importance of this vaccine but patient still declined. Advised may receive this vaccine at local pharmacy or Health Dept. Aware to provide a copy of  the vaccination record if obtained from local pharmacy or Health Dept. Verbalized acceptance and understanding.   Covid-19 vaccine status: Information provided on how to obtain vaccines.   Qualifies for Shingles Vaccine? Yes    Zostavax completed Yes   Shingrix Completed?: Yes  Screening Tests Health Maintenance  Topic Date Due   Hepatitis C Screening  Never done   DTaP/Tdap/Td (3 - Tdap) 09/28/2016   Pneumonia Vaccine 8+ Years old (1 of 2 - PCV) 04/29/2023 (Originally 06/11/1956)   COVID-19 Vaccine (4 - 2023-24 season) 05/04/2023 (Originally 01/15/2023)   INFLUENZA VACCINE  08/14/2023 (Originally 12/15/2022)   Colonoscopy  01/20/2024   MAMMOGRAM  03/11/2024   Medicare Annual Wellness (AWV)  04/17/2024   DEXA SCAN  Completed   Zoster Vaccines- Shingrix  Completed   HPV VACCINES  Aged Out    Health Maintenance  Health Maintenance Due  Topic Date Due   Hepatitis C Screening  Never done   DTaP/Tdap/Td (3 - Tdap) 09/28/2016    Colorectal cancer screening: Type of screening: Colonoscopy. Completed 01/20/2023. Repeat every 1 years  Mammogram status: scheduled for 04/28/2023  Bone Density status: Completed 01/07/2022.   Lung Cancer Screening: (Low Dose CT Chest recommended if Age 7-80 years, 20 pack-year currently smoking OR have quit w/in 15years.) does not qualify.   Lung Cancer Screening Referral: no  Additional Screening:  Hepatitis C Screening: does qualify;   Vision Screening: Recommended annual ophthalmology exams for early detection of glaucoma and other disorders of the eye. Is the patient up to date with their annual eye exam?  No  Who is the provider or what is the name of the office in which the patient attends annual eye exams? none If pt is not established with a provider, would they like to be referred to a provider to establish care? No .   Dental Screening: Recommended annual dental exams for proper oral hygiene  Diabetic Foot Exam: n/a  Community Resource Referral / Chronic Care Management: CRR required this visit?  No   CCM required this visit?  No     Plan:     I have personally reviewed and noted the following in the patient's chart:   Medical and social history Use  of alcohol, tobacco or illicit drugs  Current medications and supplements including opioid prescriptions. Patient is not currently taking opioid prescriptions. Functional ability and status Nutritional status Physical activity Advanced directives List of other physicians Hospitalizations, surgeries, and ER visits in previous 12 months Vitals Screenings to include cognitive, depression, and falls Referrals and appointments  In addition, I have reviewed and discussed with patient certain preventive protocols, quality metrics, and best practice recommendations. A written personalized care plan for preventive services as well as general preventive health recommendations were provided to patient.     Barb Merino, LPN   01/0/2725   After Visit Summary: (MyChart) Due to this being a telephonic visit, the after visit summary with patients personalized plan was offered to patient via MyChart   Nurse Notes: none

## 2023-04-18 NOTE — Patient Instructions (Addendum)
Charlotte Blackwell , Thank you for taking time to come for your Medicare Wellness Visit. I appreciate your ongoing commitment to your health goals. Please review the following plan we discussed and let me know if I can assist you in the future.   Referrals/Orders/Follow-Ups/Clinician Recommendations: none  This is a list of the screening recommended for you and due dates:  Health Maintenance  Topic Date Due   Hepatitis C Screening  Never done   DTaP/Tdap/Td vaccine (3 - Tdap) 09/28/2016   Pneumonia Vaccine (1 of 2 - PCV) 04/29/2023*   COVID-19 Vaccine (4 - 2023-24 season) 05/04/2023*   Flu Shot  08/14/2023*   Colon Cancer Screening  01/20/2024   Mammogram  03/11/2024   Medicare Annual Wellness Visit  04/17/2024   DEXA scan (bone density measurement)  Completed   Zoster (Shingles) Vaccine  Completed   HPV Vaccine  Aged Out  *Topic was postponed. The date shown is not the original due date.    Advanced directives: (In Chart) A copy of your advanced directives are scanned into your chart should your provider ever need it.  Next Medicare Annual Wellness Visit scheduled for next year: No, schedule is not open for next year  Insert Preventive Care attachment Insert FALL PREVENTION attachment if needed

## 2023-04-19 ENCOUNTER — Encounter: Payer: Self-pay | Admitting: Family Medicine

## 2023-04-19 ENCOUNTER — Other Ambulatory Visit: Payer: Self-pay | Admitting: Family Medicine

## 2023-04-24 ENCOUNTER — Other Ambulatory Visit: Payer: Self-pay | Admitting: Family Medicine

## 2023-04-24 ENCOUNTER — Telehealth: Payer: Self-pay

## 2023-04-24 MED ORDER — PRAVASTATIN SODIUM 10 MG PO TABS: 10.0000 mg | ORAL_TABLET | Freq: Every day | ORAL | 1 refills | Status: DC

## 2023-04-24 NOTE — Telephone Encounter (Signed)
Copied from CRM [id number]. Topic: Clinical - Medication Question >> Apr 24, 2023  2:17 PM Prudencio Pair wrote: Reason for CRM: Patient called stating that she sent a message to Dr. Constance Goltz on Thursday about a cholesterol med. She stated that he was suppose to send it to the pharmacy but he hasn't yet. Patient is wanting to know if he's going to send it or not. Checked and do not see that a prescription has been sent yet. Please give the patient a call to let her know whether or not the medication will be sent. CB#: [phone].

## 2023-04-24 NOTE — Telephone Encounter (Signed)
I sent the prescription in for pravastatin today.  Please let the patient know.

## 2023-04-25 NOTE — Telephone Encounter (Signed)
LVM requesting a return call regarding new Pravastatin script that was sent to pharmacy.

## 2023-04-28 ENCOUNTER — Ambulatory Visit
Admission: RE | Admit: 2023-04-28 | Discharge: 2023-04-28 | Disposition: A | Discharge status: Home / Self Care | Payer: Medicare Other | Source: Ambulatory Visit | Attending: Family Medicine | Admitting: Family Medicine

## 2023-04-28 DIAGNOSIS — Z1231 Encounter for screening mammogram for malignant neoplasm of breast: Secondary | ICD-10-CM

## 2023-05-29 ENCOUNTER — Ambulatory Visit (INDEPENDENT_AMBULATORY_CARE_PROVIDER_SITE_OTHER): Payer: Medicare Other | Admitting: Family Medicine

## 2023-05-29 VITALS — BP 98/64 | HR 92 | Ht 66.0 in | Wt 136.1 lb

## 2023-05-29 DIAGNOSIS — J329 Chronic sinusitis, unspecified: Secondary | ICD-10-CM | POA: Insufficient documentation

## 2023-05-29 MED ORDER — AMOXICILLIN-POT CLAVULANATE 875-125 MG PO TABS: 1.0000 | ORAL_TABLET | Freq: Two times a day (BID) | ORAL | 0 refills | Status: AC

## 2023-05-29 MED ORDER — AZELASTINE HCL 0.1 % NA SOLN: 1.0000 | Freq: Two times a day (BID) | NASAL | 2 refills | Status: DC

## 2023-05-29 NOTE — Patient Instructions (Signed)
 It was nice to see you today,  We addressed the following topics today: -I have sent in the prescription for an antibiotic to take twice a day for the next 7 days. - I have also sent in a prescription for a antihistamine nasal spray called azelastine . - If your symptoms are not better after taking the antibiotic for 7 days let us  know and I will order a CT scan of your sinuses.  Have a great day,  Rolan Slain, MD

## 2023-05-29 NOTE — Progress Notes (Signed)
   Acute Office Visit  Subjective:     Patient ID: Charlotte Blackwell, female    DOB: 23-Sep-1950, 73 y.o.   MRN: [id number]  Chief Complaint  Patient presents with   Nasal Congestion    HPI Patient is in today for nasal congestion.  Patient has had issues with nasal congestion that only seems to clear up for less than a week at a time dating back to the last time I saw her in October.  Has tried nasal saline, is taking Flonase daily, oral antihistamines, nasal and oral decongestants.  None of this seems to help.  At most provide only temporary relief.  Patient also complains of toothache in the front left top lateral incisor.  This worsens when she gets worsening of her sinus congestion.  Has not noticed any purulence or drainage from her gums.  Patient denies fevers, shortness of breath.  Cough is mild.   ROS      Objective:    BP 98/64   Pulse 92   Ht 5' 6 (1.676 m)   Wt 136 lb 1.9 oz (61.7 kg)   SpO2 98%   BMI 21.97 kg/m    Physical Exam General: Alert, oriented HEENT: Bilateral swelling of the nasal turbinates.  Congested sounding voice. CV: Regular rate and rhythm Pulmonary: Lungs clear bilaterally no wheezes or crackles   No results found for any visits on 05/29/23.      Assessment & Plan:   Chronic sinusitis, unspecified location Assessment & Plan: Continues to have nasal congestion going on for past few months.  Headaches are occasional.  No ear complaints.  No sore throat, cough is minimal.  Has tried every over-the-counter treatment for nasal congestion including nasal saline, nasal corticosteroids, oral antihistamines, and decongestants.  Has not taken antibiotics.  Will try 7 days of Augmentin  today.  Also recommended tries taking azelastine .  If not improved after finishing antibiotics will do maxillofacial CT scan.  Pending that result may refer to ENT or allergy specialist.   Other orders -     Amoxicillin -Pot Clavulanate; Take 1 tablet by mouth 2  (two) times daily for 7 days.  Dispense: 14 tablet; Refill: 0 -     Azelastine  HCl; Place 1 spray into both nostrils 2 (two) times daily. Use in each nostril as directed  Dispense: 30 mL; Refill: 2     Return if symptoms worsen or fail to improve.  Toribio MARLA Slain, MD

## 2023-05-29 NOTE — Assessment & Plan Note (Signed)
 Continues to have nasal congestion going on for past few months.  Headaches are occasional.  No ear complaints.  No sore throat, cough is minimal.  Has tried every over-the-counter treatment for nasal congestion including nasal saline, nasal corticosteroids, oral antihistamines, and decongestants.  Has not taken antibiotics.  Will try 7 days of Augmentin  today.  Also recommended tries taking azelastine .  If not improved after finishing antibiotics will do maxillofacial CT scan.  Pending that result may refer to ENT or allergy specialist.

## 2023-06-28 ENCOUNTER — Ambulatory Visit (INDEPENDENT_AMBULATORY_CARE_PROVIDER_SITE_OTHER): Payer: Medicare Other | Admitting: Family Medicine

## 2023-06-28 ENCOUNTER — Encounter: Payer: Self-pay | Admitting: Family Medicine

## 2023-06-28 VITALS — BP 106/55 | HR 100 | Ht 66.0 in | Wt 129.0 lb

## 2023-06-28 DIAGNOSIS — B370 Candidal stomatitis: Secondary | ICD-10-CM | POA: Insufficient documentation

## 2023-06-28 DIAGNOSIS — R197 Diarrhea, unspecified: Secondary | ICD-10-CM | POA: Diagnosis not present

## 2023-06-28 MED ORDER — NYSTATIN 100000 UNIT/ML MT SUSP
200000.0000 [IU] | Freq: Four times a day (QID) | OROMUCOSAL | 1 refills | Status: AC
Start: 2023-06-28 — End: 2023-07-05

## 2023-06-28 NOTE — Progress Notes (Signed)
   Acute Office Visit  Subjective:     Patient ID: Charlotte Blackwell, female    DOB: 1951/02/27, 73 y.o.   MRN: [id number]  Chief Complaint  Patient presents with   Diarrhea    HPI Patient is in today for diarrhea.  Patient recently treated with Augmentin for chronic sinusitis.  A few days after taking that medication she developed diarrhea.  Diarrhea continued to worsen.  At its peak she was having 5 liquid bowel movements a day.  Currently having 3-4 bowel movements, all liquid, a day.  No blood.  Patient has been taking Imodium for the past week.  Also taking a probiotic.  No nausea vomiting.  Patient states that 2 days ago she noticed a white plaque on her tongue.  It is not painful or burning.  Patient states that the other day she fell over while bending over to pick up a spoon from the floor.  States she could not get up for 45 minutes.  This was 4 days ago.  Did not have a syncopal episode, lost her balance.  ROS      Objective:    BP (!) 106/55   Pulse 100   Ht 5\' 6"  (1.676 m)   Wt 129 lb (58.5 kg)   SpO2 96%   BMI 20.82 kg/m    Physical Exam General: Alert and oriented HEENT: White plaque over the lateral edges of the tongue. Pulmonary: No respiratory distress.  No results found for any visits on 06/28/23.      Assessment & Plan:   Diarrhea of presumed infectious origin Assessment & Plan: Patient has had diarrhea since starting antibiotics.  This has been over 3 weeks.  Patient has lost weight during that time.  Provided container for patient to collect stool sample at home and bring back to [place] so we can test for GI pathogen panel that includes C. difficile.  Encouraged her to use probiotics and Metamucil until we can rule out infectious cause.  Encouraged her to drink plenty of water to avoid dehydration.  Orders: -     GI Profile, Stool, PCR; Future  Thrush Assessment & Plan: Also likely caused by her antibiotic use.  Provided oral nystatin for her  to use for the next 7 days.   Other orders -     Nystatin; Take 2 mLs (200,000 Units total) by mouth 4 (four) times daily for 7 days. Apply 1mL to each cheek  Dispense: 60 mL; Refill: 1     Return if symptoms worsen or fail to improve.  Sandre Kitty, MD

## 2023-06-28 NOTE — Assessment & Plan Note (Signed)
Patient has had diarrhea since starting antibiotics.  This has been over 3 weeks.  Patient has lost weight during that time.  Provided container for patient to collect stool sample at home and bring back to [place] so we can test for GI pathogen panel that includes C. difficile.  Encouraged her to use probiotics and Metamucil until we can rule out infectious cause.  Encouraged her to drink plenty of water to avoid dehydration.

## 2023-06-28 NOTE — Assessment & Plan Note (Signed)
Also likely caused by her antibiotic use.  Provided oral nystatin for her to use for the next 7 days.

## 2023-06-28 NOTE — Patient Instructions (Signed)
It was nice to see you today,  We addressed the following topics today: -We will send you home with a container to collect a stool sample.  You can then bring that back to [place] so that we can check for infectious causes of diarrhea - You can take your probiotics and I would also take Metamucil to help with bulking of the stool. - Once we find out if it is due to an infection we can prescribe antibiotics - Please wash your hands frequently especially when coming into contact with others to avoid spreading any infection - I have prescribed a oral solution that is an antifungal that should help with the thrush on your tongue.  Use it 4 times a day for a week. - Eat what ever you can tolerate and drink plenty of fluids to avoid dehydration.  Have a great day,  Frederic Jericho, MD

## 2023-06-29 ENCOUNTER — Other Ambulatory Visit: Payer: Self-pay | Admitting: *Deleted

## 2023-06-29 DIAGNOSIS — R197 Diarrhea, unspecified: Secondary | ICD-10-CM

## 2023-07-01 LAB — GI PROFILE, STOOL, PCR

## 2023-07-01 LAB — SPECIMEN STATUS REPORT

## 2023-07-03 ENCOUNTER — Encounter: Payer: Self-pay | Admitting: *Deleted

## 2023-07-03 ENCOUNTER — Telehealth: Payer: Self-pay

## 2023-07-03 ENCOUNTER — Other Ambulatory Visit: Payer: Self-pay | Admitting: Family Medicine

## 2023-07-03 MED ORDER — VANCOMYCIN HCL 125 MG PO CAPS: 125.0000 mg | ORAL_CAPSULE | Freq: Four times a day (QID) | ORAL | 0 refills | Status: AC

## 2023-07-03 MED ORDER — VANCOMYCIN HCL 125 MG PO CAPS: 125.0000 mg | ORAL_CAPSULE | Freq: Four times a day (QID) | ORAL | 0 refills | Status: DC

## 2023-07-03 NOTE — Telephone Encounter (Signed)
Prescription was resent to [place] [facility].

## 2023-07-03 NOTE — Telephone Encounter (Signed)
Copied from CRM [id number]. Topic: Clinical - Prescription Issue >> Jul 03, 2023 12:17 PM Maxwell Marion wrote: Reason for CRM: Patient would like vancomycin prescription sent to [facility] and not [facility]

## 2023-07-04 ENCOUNTER — Telehealth: Payer: Self-pay

## 2023-07-04 ENCOUNTER — Encounter: Payer: Self-pay | Admitting: Family Medicine

## 2023-07-04 NOTE — Telephone Encounter (Signed)
Can you call [place] and clarify what the issue is?  If she did not have the prescription filled at the [facility] then there should not be an issue with filling the prescription.  If they need me to resend them please let me know.

## 2023-07-04 NOTE — Telephone Encounter (Signed)
Called PD they stated that they will call [facility] and see what's the issue the pharmacist said it may need a pay claim if not a PA

## 2023-07-04 NOTE — Telephone Encounter (Signed)
Copied from CRM [id number]. Topic: Clinical - Prescription Issue >> Jul 04, 2023 12:49 PM Antony Haste wrote: Reason for CRM: PT states she spoke with [facility] regarding her vancomycin (VANCOCIN) 125 MG capsule, they have received an error on their end and she is needing this re-sent to the [facility] instead. She states she also spoke with the [facility] this morning and they informed her they cannot do anything since her order was sent to [facility] already? She states she is willing to have an alternative of vancomycin sent to [place] if needed. Callback [phone]

## 2023-07-05 ENCOUNTER — Telehealth: Payer: Self-pay

## 2023-07-05 NOTE — Telephone Encounter (Signed)
Copied from CRM [id number]. Topic: General - Other >> Jul 04, 2023  4:34 PM Kristie Cowman wrote: Reason for CRM: Judeth Cornfield from [facility] called stating that a prior authorization is needed through [facility] for the vancomycin prescription.

## 2023-07-05 NOTE — Telephone Encounter (Signed)
Called PD she stated that the pay claim went through

## 2023-07-31 ENCOUNTER — Ambulatory Visit: Payer: Medicare Other | Admitting: Family Medicine

## 2023-08-02 ENCOUNTER — Ambulatory Visit: Payer: Medicare Other | Admitting: Family Medicine

## 2023-08-08 ENCOUNTER — Telehealth: Payer: Self-pay

## 2023-08-08 ENCOUNTER — Other Ambulatory Visit: Payer: Self-pay | Admitting: Family Medicine

## 2023-08-08 DIAGNOSIS — E782 Mixed hyperlipidemia: Secondary | ICD-10-CM

## 2023-08-08 NOTE — Telephone Encounter (Signed)
Order for lipid panel placed.

## 2023-08-08 NOTE — Telephone Encounter (Signed)
 ok

## 2023-08-08 NOTE — Telephone Encounter (Signed)
 Copied from CRM [id number]. Topic: Clinical - Request for Lab/Test Order >> Aug 08, 2023  2:37 PM Gery Pray wrote: Reason for CRM: Patient would like for the provider to put in for lab work before her appointment on 04/24 for the pravastatin (PRAVACHOL) 10 MG tablet.

## 2023-09-07 ENCOUNTER — Encounter: Payer: Self-pay | Admitting: Family Medicine

## 2023-09-07 ENCOUNTER — Ambulatory Visit (INDEPENDENT_AMBULATORY_CARE_PROVIDER_SITE_OTHER): Payer: Medicare Other | Admitting: Family Medicine

## 2023-09-07 VITALS — BP 130/71 | HR 82 | Ht 66.0 in | Wt 126.8 lb

## 2023-09-07 DIAGNOSIS — R197 Diarrhea, unspecified: Secondary | ICD-10-CM | POA: Diagnosis not present

## 2023-09-07 DIAGNOSIS — I10 Essential (primary) hypertension: Secondary | ICD-10-CM

## 2023-09-07 DIAGNOSIS — L659 Nonscarring hair loss, unspecified: Secondary | ICD-10-CM | POA: Diagnosis not present

## 2023-09-07 NOTE — Patient Instructions (Signed)
 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  This should be temporary. - You can take biotin or B12 complex supplements to help your hair growth but the effect will be mild. - You can schedule your lab test at your earliest convenience. - You can use probiotics in the form of supplements for from things like yogurt to help your gut grow healthy bacteria that may have been destroyed by your C. difficile infection.  Florastor is the most popular supplement brand.  Have a great day,  Etha Henle, MD

## 2023-09-07 NOTE — Progress Notes (Unsigned)
   Established Patient Office Visit  Subjective   Patient ID: Charlotte Blackwell, female    DOB: 1951/01/14  Age: 73 y.o. MRN: [id number]  Chief Complaint  Patient presents with   Medical Management of Chronic Issues    HPI Subjective: - Reports hair loss for past 2-3 weeks, "handfuls" of hair falling out - Reports passing out episode during C. diff infection, fell and hit knee - Reports tooth became loose after fall, required extraction of 4 teeth.  Has caused $5000 to have her teeth replaced.  Has had issues with these teeth in the past. - Reports C. diff infection resolved, bowel movements now normal - Reports C. diff symptoms lasted 6 weeks including treatment period - Reports taking biotin for 2 weeks, feels hair loss may be slowing  Past Medical History: - C. diff infection following Augmentin  treatment for sinusitis - Periodontal surgery 30 years ago - Recent dental extraction of 4 teeth   The 10-year ASCVD risk score (Arnett DK, et al., 2019) is: 30%  Health Maintenance Due  Topic Date Due   Hepatitis C Screening  Never done   Pneumonia Vaccine 80+ Years old (1 of 2 - PCV) Never done   DTaP/Tdap/Td (3 - Tdap) 09/28/2016   COVID-19 Vaccine (4 - 2024-25 season) 01/15/2023      Objective:     BP 130/71   Pulse 82   Ht 5\' 6"  (1.676 m)   Wt 126 lb 12.8 oz (57.5 kg)   SpO2 97%   BMI 20.47 kg/m    Physical Exam General: Alert, oriented HEENT: Diffuse hair loss Pulmonary: No respiratory distress Psych: Pleasant affect   No results found for any visits on 09/07/23.      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 difficile infection.  Also consider female pattern baldness but less likely given the rapid change in her hair loss over the past several months.  On exam there are no patches of hair loss.  It is diffuse.  No scarring.  She states she does have an appointment with  her dermatologist upcoming regarding this.  Discussed the transient nature of telogen effluvium.  Discussed long-term treatment options of hair loss including minoxidil topical.  Recommended she continue her B vitamin supplements.  Orders: -     Iron, TIBC and Ferritin Panel; Future -     CBC with Differential/Platelet; Future -     TSH; Future -     Comprehensive metabolic panel with GFR; Future  Diarrhea of presumed infectious origin Assessment & Plan: Diarrhea from C. difficile infection has resolved.  Discussed importance of reestablishing good gut microbiota with probiotics, either from supplements or food sources such as yogurt, fermented foods.   Essential hypertension -     Comprehensive metabolic panel with GFR; Future     Return in about 6 months (around 03/08/2024) for hld.    Laneta Pintos, MD

## 2023-09-08 DIAGNOSIS — L659 Nonscarring hair loss, unspecified: Secondary | ICD-10-CM | POA: Insufficient documentation

## 2023-09-08 NOTE — Assessment & Plan Note (Signed)
 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 difficile infection.  Also consider female pattern baldness but less likely given the rapid change in her hair loss over the past several months.  On exam there are no patches of hair loss.  It is diffuse.  No scarring.  She states she does have an appointment with her dermatologist upcoming regarding this.  Discussed the transient nature of telogen effluvium.  Discussed long-term treatment options of hair loss including minoxidil topical.  Recommended she continue her B vitamin supplements.

## 2023-09-08 NOTE — Assessment & Plan Note (Signed)
 Diarrhea from C. difficile infection has resolved.  Discussed importance of reestablishing good gut microbiota with probiotics, either from supplements or food sources such as yogurt, fermented foods.

## 2023-09-16 ENCOUNTER — Encounter: Payer: Self-pay | Admitting: Family Medicine

## 2023-09-28 ENCOUNTER — Other Ambulatory Visit

## 2023-09-28 DIAGNOSIS — E782 Mixed hyperlipidemia: Secondary | ICD-10-CM

## 2023-09-28 DIAGNOSIS — I10 Essential (primary) hypertension: Secondary | ICD-10-CM

## 2023-09-28 DIAGNOSIS — L659 Nonscarring hair loss, unspecified: Secondary | ICD-10-CM

## 2023-09-29 ENCOUNTER — Ambulatory Visit: Payer: Self-pay | Admitting: Family Medicine

## 2023-09-29 LAB — IRON,TIBC AND FERRITIN PANEL
Ferritin: 106 ng/mL (ref 15–150)
Iron Saturation: 36 % (ref 15–55)
Iron: 98 ug/dL (ref 27–139)
Total Iron Binding Capacity: 275 ug/dL (ref 250–450)
UIBC: 177 ug/dL (ref 118–369)

## 2023-09-29 LAB — CBC WITH DIFFERENTIAL/PLATELET
Basophils Absolute: 0 10*3/uL (ref 0.0–0.2)
Basos: 1 %
EOS (ABSOLUTE): 0.2 10*3/uL (ref 0.0–0.4)
Eos: 3 %
Hematocrit: 43.2 % (ref 34.0–46.6)
Hemoglobin: 14.1 g/dL (ref 11.1–15.9)
Immature Grans (Abs): 0 10*3/uL (ref 0.0–0.1)
Immature Granulocytes: 0 %
Lymphocytes Absolute: 2.1 10*3/uL (ref 0.7–3.1)
Lymphs: 30 %
MCH: 29.6 pg (ref 26.6–33.0)
MCHC: 32.6 g/dL (ref 31.5–35.7)
MCV: 91 fL (ref 79–97)
Monocytes Absolute: 0.4 10*3/uL (ref 0.1–0.9)
Monocytes: 5 %
Neutrophils Absolute: 4.2 10*3/uL (ref 1.4–7.0)
Neutrophils: 61 %
Platelets: 235 10*3/uL (ref 150–450)
RBC: 4.77 x10E6/uL (ref 3.77–5.28)
RDW: 12.4 % (ref 11.7–15.4)
WBC: 7 10*3/uL (ref 3.4–10.8)

## 2023-09-29 LAB — LIPID PANEL
Chol/HDL Ratio: 3.5 ratio (ref 0.0–4.4)
Cholesterol, Total: 152 mg/dL (ref 100–199)
HDL: 44 mg/dL (ref >39)
LDL Chol Calc (NIH): 89 mg/dL (ref 0–99)
Triglycerides: 103 mg/dL (ref 0–149)
VLDL Cholesterol Cal: 19 mg/dL (ref 5–40)

## 2023-09-29 LAB — COMPREHENSIVE METABOLIC PANEL WITH GFR
ALT: 15 IU/L (ref 0–32)
AST: 20 IU/L (ref 0–40)
Albumin: 4.3 g/dL (ref 3.8–4.8)
Alkaline Phosphatase: 82 IU/L (ref 44–121)
BUN/Creatinine Ratio: 23 (ref 12–28)
BUN: 14 mg/dL (ref 8–27)
Bilirubin Total: 0.4 mg/dL (ref 0.0–1.2)
CO2: 21 mmol/L (ref 20–29)
Calcium: 9.8 mg/dL (ref 8.7–10.3)
Chloride: 103 mmol/L (ref 96–106)
Creatinine, Ser: 0.62 mg/dL (ref 0.57–1.00)
Globulin, Total: 2.7 g/dL (ref 1.5–4.5)
Glucose: 92 mg/dL (ref 70–99)
Potassium: 4.3 mmol/L (ref 3.5–5.2)
Sodium: 140 mmol/L (ref 134–144)
Total Protein: 7 g/dL (ref 6.0–8.5)
eGFR: 93 mL/min/{1.73_m2} (ref >59)

## 2023-09-29 LAB — TSH: TSH: 1.56 u[IU]/mL (ref 0.450–4.500)

## 2023-10-04 ENCOUNTER — Other Ambulatory Visit: Payer: Self-pay | Admitting: Family Medicine

## 2023-10-04 DIAGNOSIS — F5101 Primary insomnia: Secondary | ICD-10-CM

## 2023-10-06 ENCOUNTER — Other Ambulatory Visit: Payer: Self-pay | Admitting: Family Medicine

## 2023-10-06 DIAGNOSIS — I10 Essential (primary) hypertension: Secondary | ICD-10-CM

## 2023-10-20 ENCOUNTER — Other Ambulatory Visit: Payer: Self-pay | Admitting: Family Medicine

## 2023-12-21 ENCOUNTER — Encounter: Payer: Self-pay | Admitting: Family Medicine

## 2023-12-22 ENCOUNTER — Other Ambulatory Visit: Payer: Self-pay | Admitting: Family Medicine

## 2023-12-22 DIAGNOSIS — M1990 Unspecified osteoarthritis, unspecified site: Secondary | ICD-10-CM

## 2023-12-22 MED ORDER — MELOXICAM 15 MG PO TABS: 15.0000 mg | ORAL_TABLET | Freq: Every day | ORAL | 1 refills | Status: DC

## 2023-12-25 ENCOUNTER — Other Ambulatory Visit: Payer: Self-pay | Admitting: Family Medicine

## 2023-12-25 DIAGNOSIS — F5101 Primary insomnia: Secondary | ICD-10-CM

## 2024-02-15 ENCOUNTER — Ambulatory Visit: Admitting: Family Medicine

## 2024-02-15 ENCOUNTER — Encounter: Payer: Self-pay | Admitting: Family Medicine

## 2024-02-15 VITALS — BP 150/71 | HR 67 | Ht 66.0 in | Wt 132.1 lb

## 2024-02-15 DIAGNOSIS — L659 Nonscarring hair loss, unspecified: Secondary | ICD-10-CM

## 2024-02-15 DIAGNOSIS — I10 Essential (primary) hypertension: Secondary | ICD-10-CM

## 2024-02-15 MED ORDER — LISINOPRIL 40 MG PO TABS: 40.0000 mg | ORAL_TABLET | Freq: Every day | ORAL | 3 refills | Status: AC

## 2024-02-15 MED ORDER — HYDROCHLOROTHIAZIDE 12.5 MG PO TABS: 12.5000 mg | ORAL_TABLET | Freq: Every day | ORAL | 3 refills | Status: AC

## 2024-02-15 NOTE — Assessment & Plan Note (Signed)
 Home blood pressure readings are labile, ranging from 100s to 160s systolic. Currently on lisinopril  40mg  daily and taking hydrochlorothiazide  12.5mg  on an as-needed basis. Previous episodes of hypotension (e.g., 96/64, 106/55) were during C. diff infection in February. - Restart hydrochlorothiazide  12.5mg  daily. To be taken every day, not as needed. - A new prescription for hydrochlorothiazide  12.5mg  sent to [facility] today. - Will send a prescription for lisinopril  40mg  tablets (one pill daily) post-dated for 3 months from now, as has a current 90-day supply of lisinopril  20mg . - Counseled on blood pressure goals: ideally under 130 systolic on average. If it drops under 100 or if symptomatic with dizziness, should contact the office.

## 2024-02-15 NOTE — Assessment & Plan Note (Addendum)
 Reports improvement with topical minoxidil and oral finasteride.  - Continue current treatment.

## 2024-02-15 NOTE — Progress Notes (Signed)
   Established Patient Office Visit  Subjective   Patient ID: Charlotte Blackwell, female    DOB: 17-Nov-1950  Age: 73 y.o. MRN: [id number]  Chief Complaint  Patient presents with   Hypertension    HPI  Subjective - Follow-up for hypertension. Reports blood pressure readings are all over. Home readings fluctuate between normal (e.g., 110/70s) and high (e.g., 165/70s). Reports diastolic is always low, in the 60s or 70s. No specific pattern to elevations, can be high in the morning or randomly. Reports restarting hydrochlorothiazide  as necessary and this lowers the blood pressure. Denies dizziness or weakness. - Discussed hair loss. Previously diagnosed as telogen effluvium, likely related to C. diff infection in February. Reports using topical minoxidil once daily which seems to be effective. Also taking finasteride prescribed by dermatologist,    Medications: Lisinopril  40mg  daily (takes two 20mg  tablets). Reports having restarted hydrochlorothiazide  12.5mg , taking it as needed. Also mentions using topical minoxidil once daily and   finasteride for hair loss.  PMH, PSH, FH, Social Hx: PMHx: Hypertension, telogen effluvium, C. diff infection (February).  ROS: Constitutional: Denies weakness. Cardiovascular: Reports fluctuating home blood pressure readings. Denies dizziness. Dermatologic: Reports hair loss, improved with treatment.    The 10-year ASCVD risk score (Arnett DK, et al., 2019) is: 37.2%  Health Maintenance Due  Topic Date Due   Hepatitis C Screening  Never done   Pneumococcal Vaccine: 50+ Years (1 of 2 - PCV) Never done   DTaP/Tdap/Td (3 - Tdap) 09/28/2016   Influenza Vaccine  Never done   COVID-19 Vaccine (4 - 2025-26 season) 01/15/2024   Colonoscopy  01/20/2024      Objective:     BP (!) 150/71   Pulse 67   Ht 5' 6 (1.676 m)   Wt 132 lb 1.9 oz (59.9 kg)   SpO2 95%   BMI 21.32 kg/m    Physical Exam Gen: alert, oriented Pulm: no respiratory  distress Psych: pleasant affect   No results found for any visits on 02/15/24.      Assessment & Plan:   Hair loss Assessment & Plan: Reports improvement with topical minoxidil. Also taking an oral medication from dermatology, possibly finasteride. - Continue current treatment.   Essential hypertension Assessment & Plan: Home blood pressure readings are labile, ranging from 100s to 160s systolic. Currently on lisinopril  40mg  daily and taking hydrochlorothiazide  12.5mg  on an as-needed basis. Previous episodes of hypotension (e.g., 96/64, 106/55) were during C. diff infection in February. - Restart hydrochlorothiazide  12.5mg  daily. To be taken every day, not as needed. - A new prescription for hydrochlorothiazide  12.5mg  sent to [facility] today. - Will send a prescription for lisinopril  40mg  tablets (one pill daily) post-dated for 3 months from now, as has a current 90-day supply of lisinopril  20mg . - Counseled on blood pressure goals: ideally under 130 systolic on average. If it drops under 100 or if symptomatic with dizziness, should contact the office.   Other orders -     hydroCHLOROthiazide ; Take 1 tablet (12.5 mg total) by mouth daily.  Dispense: 90 tablet; Refill: 3 -     Lisinopril ; Take 1 tablet (40 mg total) by mouth daily.  Dispense: 90 tablet; Refill: 3     Return for already on file.    Toribio MARLA Slain, MD

## 2024-02-15 NOTE — Patient Instructions (Signed)
 It was nice to see you today,  We addressed the following topics today: -I am sending in hydrochlorothiazide  12.5 mg.  Take this every day. - I will send in a future prescription for about 90 days from now for your lisinopril  as a 40 mg pill so that you do not have to take 2 of them. - You have an appointment with me upcoming later this month.  We can recheck your labs at that time.   Have a great day,  Rolan Slain, MD

## 2024-02-24 ENCOUNTER — Other Ambulatory Visit: Payer: Self-pay | Admitting: Family Medicine

## 2024-02-24 DIAGNOSIS — M1990 Unspecified osteoarthritis, unspecified site: Secondary | ICD-10-CM

## 2024-03-03 ENCOUNTER — Other Ambulatory Visit: Payer: Self-pay | Admitting: Family Medicine

## 2024-03-03 DIAGNOSIS — E782 Mixed hyperlipidemia: Secondary | ICD-10-CM

## 2024-03-04 ENCOUNTER — Other Ambulatory Visit

## 2024-03-04 DIAGNOSIS — E782 Mixed hyperlipidemia: Secondary | ICD-10-CM

## 2024-03-05 ENCOUNTER — Ambulatory Visit: Payer: Self-pay | Admitting: Family Medicine

## 2024-03-05 LAB — COMPREHENSIVE METABOLIC PANEL WITH GFR
ALT: 16 IU/L (ref 0–32)
AST: 17 IU/L (ref 0–40)
Albumin: 4.4 g/dL (ref 3.8–4.8)
Alkaline Phosphatase: 86 IU/L (ref 49–135)
BUN/Creatinine Ratio: 15 (ref 12–28)
BUN: 10 mg/dL (ref 8–27)
Bilirubin Total: 0.7 mg/dL (ref 0.0–1.2)
CO2: 26 mmol/L (ref 20–29)
Calcium: 9.8 mg/dL (ref 8.7–10.3)
Chloride: 92 mmol/L — ABNORMAL LOW (ref 96–106)
Creatinine, Ser: 0.65 mg/dL (ref 0.57–1.00)
Globulin, Total: 2.5 g/dL (ref 1.5–4.5)
Glucose: 85 mg/dL (ref 70–99)
Potassium: 4.3 mmol/L (ref 3.5–5.2)
Sodium: 131 mmol/L — ABNORMAL LOW (ref 134–144)
Total Protein: 6.9 g/dL (ref 6.0–8.5)
eGFR: 92 mL/min/1.73 (ref >59)

## 2024-03-05 LAB — LIPID PANEL
Chol/HDL Ratio: 3.4 ratio (ref 0.0–4.4)
Cholesterol, Total: 162 mg/dL (ref 100–199)
HDL: 48 mg/dL (ref >39)
LDL Chol Calc (NIH): 97 mg/dL (ref 0–99)
Triglycerides: 94 mg/dL (ref 0–149)
VLDL Cholesterol Cal: 17 mg/dL (ref 5–40)

## 2024-03-11 ENCOUNTER — Ambulatory Visit: Admitting: Family Medicine

## 2024-03-11 ENCOUNTER — Encounter: Payer: Self-pay | Admitting: Family Medicine

## 2024-03-11 VITALS — BP 132/73 | HR 68 | Ht 66.0 in | Wt 132.4 lb

## 2024-03-11 DIAGNOSIS — F5101 Primary insomnia: Secondary | ICD-10-CM | POA: Diagnosis not present

## 2024-03-11 DIAGNOSIS — E782 Mixed hyperlipidemia: Secondary | ICD-10-CM | POA: Diagnosis not present

## 2024-03-11 DIAGNOSIS — E871 Hypo-osmolality and hyponatremia: Secondary | ICD-10-CM | POA: Diagnosis not present

## 2024-03-11 DIAGNOSIS — I10 Essential (primary) hypertension: Secondary | ICD-10-CM

## 2024-03-11 MED ORDER — PRAVASTATIN SODIUM 20 MG PO TABS: 20.0000 mg | ORAL_TABLET | Freq: Every day | ORAL | 3 refills | Status: AC

## 2024-03-11 NOTE — Assessment & Plan Note (Signed)
 Mildly low sodium at 131 on recent labs, likely secondary to hydrochlorothiazide . Currently asymptomatic. Counseled that significantly low sodium can cause neurologic symptoms, but current level is not a concern. - Recheck basic metabolic panel in 8 weeks to monitor sodium. - If sodium continues to decrease, will consider stopping hydrochlorothiazide  and starting a different agent.

## 2024-03-11 NOTE — Progress Notes (Signed)
 Established Patient Office Visit  Subjective   Patient ID: Charlotte Blackwell, female    DOB: Jul 02, 1950  Age: 73 y.o. MRN: [id number]  Chief Complaint  Patient presents with   Medical Management of Chronic Issues    HPI  Subjective - Hypertension: Follow-up. Reports home BP readings are okay, around 136. No issues with adding hydrochlorothiazide . - Hyperlipidemia: Follow-up on pravastatin . - Insomnia: Reports sleep medication (Ambien ) is not working effectively. States it only works if already sleepy. Takes medication around 12:30 AM but often remains awake until 3:00 AM. Sleeps in two-hour intervals and wakes up around 10:00 AM or 10:30 AM. Acknowledges sleep schedule has shifted later over the past year. 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 Has not tried other prescription medications. Tried over-the-counter melatonin a long time ago, but it caused restless leg syndrome.  Medications Current medications include lisinopril  40 mg, hydrochlorothiazide  12.5 mg, pravastatin  10 mg, and Ambien .  PMH, PSH, FH, Social Hx PMHx: Hypertension, hyperlipidemia, insomnia. No history of stroke or heart attack.  ROS Pertinent positives: Reports difficulty with sleep onset and maintenance. Pertinent negatives: No symptoms from mildly low sodium.   The 10-year ASCVD risk score (Arnett DK, et al., 2019) is: 30.3%  Health Maintenance Due  Topic Date Due   Hepatitis C Screening  Never done   Pneumococcal Vaccine: 50+ Years (1 of 2 - PCV) Never done   DTaP/Tdap/Td (3 - Tdap) 09/28/2016   COVID-19 Vaccine (4 - 2025-26 season) 01/15/2024   Colonoscopy  01/20/2024   Medicare Annual Wellness (AWV)  04/17/2024      Objective:     BP 132/73   Pulse 68   Ht 5' 6 (1.676 m)   Wt 132 lb 6.4 oz (60.1 kg)   SpO2 98%   BMI 21.37 kg/m    Physical Exam Gen: alert, oriented Pulm: no respiratory distress Psych: pleasant affect   No  results found for any visits on 03/11/24.      Assessment & Plan:   Essential hypertension Assessment & Plan: Controlled on current regimen. BP in office is 132/73, similar to home readings. Recent addition of hydrochlorothiazide  is well-tolerated. Combination pill of lisinopril /HCTZ is not available in the current 40/12.5 mg dosage. - Continue lisinopril  40 mg daily. - Continue hydrochlorothiazide  12.5 mg daily. - Will monitor electrolytes.  Orders: -     Comprehensive metabolic panel with GFR; Future  Mixed hyperlipidemia Assessment & Plan:  LDL cholesterol is stable and under 100. Current pravastatin  10 mg is for primary prevention. Risk is considered average. - Increase pravastatin  to 20 mg daily. - Recheck lipid panel in 8 weeks with sodium check. - If not tolerated, can decrease back to 10 mg.  Orders: -     Lipid panel; Future -     Comprehensive metabolic panel with GFR; Future  Primary insomnia Assessment & Plan: Chronic insomnia with difficulty in sleep onset and maintenance despite using Ambien . Delayed sleep phase is likely a contributing factor. Counseled on risks of zolpidem  use in older adults. Discussed sleep hygiene, including establishing a consistent, earlier wake time to reset the sleep-wake cycle. Discussed medication options. - Will attempt sleep hygiene modifications first, primarily by setting an earlier alarm (e.g., 8:00 AM) to promote an earlier bedtime. - Discussed adding low-dose doxepin 10 mg nightly to aid sleep and facilitate tapering off Ambien  in the future. - Discussed magnesium  oxide 400 mg over-the-counter as an option. - If  these measures fail, may consider a trial of an orexin inhibitor such as suvorexant, pending insurance approval. - Follow up at next physical exam in March or April of next year.   Hyponatremia Assessment & Plan: Mildly low sodium at 131 on recent labs, likely secondary to hydrochlorothiazide . Currently asymptomatic.  Counseled that significantly low sodium can cause neurologic symptoms, but current level is not a concern. - Recheck basic metabolic panel in 8 weeks to monitor sodium. - If sodium continues to decrease, will consider stopping hydrochlorothiazide  and starting a different agent.   Other orders -     Pravastatin  Sodium; Take 1 tablet (20 mg total) by mouth daily.  Dispense: 90 tablet; Refill: 3     Return in about 5 months (around 08/09/2024) for physical.    Toribio MARLA Slain, MD

## 2024-03-11 NOTE — Patient Instructions (Signed)
 It was nice to see you today,  We addressed the following topics today: - I would like you to try setting an alarm to wake up earlier, for example at 8:00 AM, to help reset your sleep cycle. You will likely feel tired for a few days, but this should help you feel sleepy earlier in the evening. - You can go back to the 10 mg dose of pravastatin  if you do not tolerate the increased 20 mg dose. - Please come back to the lab in about 8 weeks to have your sodium and cholesterol checked. - We will schedule your next appointment, which will be your annual physical, for March or April 2026. - you can try taking 400mg  mag oxide at bedtime for sleep.   - the other medications we coiuld try are doxepin and suvorexant  Have a great day,  Rolan Slain, MD

## 2024-03-11 NOTE — Assessment & Plan Note (Signed)
 Chronic insomnia with difficulty in sleep onset and maintenance despite using Ambien . Delayed sleep phase is likely a contributing factor. Counseled on risks of zolpidem  use in older adults. Discussed sleep hygiene, including establishing a consistent, earlier wake time to reset the sleep-wake cycle. Discussed medication options. - Will attempt sleep hygiene modifications first, primarily by setting an earlier alarm (e.g., 8:00 AM) to promote an earlier bedtime. - Discussed adding low-dose doxepin 10 mg nightly to aid sleep and facilitate tapering off Ambien  in the future. - Discussed magnesium  oxide 400 mg over-the-counter as an option. - If these measures fail, may consider a trial of an orexin inhibitor such as suvorexant, pending insurance approval. - Follow up at next physical exam in March or April of next year.

## 2024-03-11 NOTE — Assessment & Plan Note (Signed)
 Controlled on current regimen. BP in office is 132/73, similar to home readings. Recent addition of hydrochlorothiazide  is well-tolerated. Combination pill of lisinopril /HCTZ is not available in the current 40/12.5 mg dosage. - Continue lisinopril  40 mg daily. - Continue hydrochlorothiazide  12.5 mg daily. - Will monitor electrolytes.

## 2024-03-11 NOTE — Assessment & Plan Note (Signed)
 LDL cholesterol is stable and under 100. Current pravastatin  10 mg is for primary prevention. Risk is considered average. - Increase pravastatin  to 20 mg daily. - Recheck lipid panel in 8 weeks with sodium check. - If not tolerated, can decrease back to 10 mg.

## 2024-03-13 ENCOUNTER — Encounter: Payer: Self-pay | Admitting: Family Medicine

## 2024-03-15 ENCOUNTER — Other Ambulatory Visit: Payer: Self-pay | Admitting: Family Medicine

## 2024-03-15 DIAGNOSIS — Z1231 Encounter for screening mammogram for malignant neoplasm of breast: Secondary | ICD-10-CM

## 2024-04-13 ENCOUNTER — Encounter: Payer: Self-pay | Admitting: Gastroenterology

## 2024-04-28 ENCOUNTER — Other Ambulatory Visit: Payer: Self-pay | Admitting: Medical Genetics

## 2024-04-29 ENCOUNTER — Ambulatory Visit

## 2024-05-13 ENCOUNTER — Other Ambulatory Visit

## 2024-05-13 DIAGNOSIS — I10 Essential (primary) hypertension: Secondary | ICD-10-CM

## 2024-05-13 DIAGNOSIS — E782 Mixed hyperlipidemia: Secondary | ICD-10-CM

## 2024-05-14 ENCOUNTER — Ambulatory Visit: Payer: Self-pay | Admitting: Family Medicine

## 2024-05-14 LAB — LIPID PANEL
Chol/HDL Ratio: 2.7 ratio (ref 0.0–4.4)
Cholesterol, Total: 143 mg/dL (ref 100–199)
HDL: 53 mg/dL
LDL Chol Calc (NIH): 75 mg/dL (ref 0–99)
Triglycerides: 76 mg/dL (ref 0–149)
VLDL Cholesterol Cal: 15 mg/dL (ref 5–40)

## 2024-05-14 LAB — COMPREHENSIVE METABOLIC PANEL WITH GFR
ALT: 14 IU/L (ref 0–32)
AST: 18 IU/L (ref 0–40)
Albumin: 4.5 g/dL (ref 3.8–4.8)
Alkaline Phosphatase: 79 IU/L (ref 49–135)
BUN/Creatinine Ratio: 29 — ABNORMAL HIGH (ref 12–28)
BUN: 15 mg/dL (ref 8–27)
Bilirubin Total: 0.6 mg/dL (ref 0.0–1.2)
CO2: 24 mmol/L (ref 20–29)
Calcium: 9.6 mg/dL (ref 8.7–10.3)
Chloride: 97 mmol/L (ref 96–106)
Creatinine, Ser: 0.52 mg/dL — ABNORMAL LOW (ref 0.57–1.00)
Globulin, Total: 2.4 g/dL (ref 1.5–4.5)
Glucose: 89 mg/dL (ref 70–99)
Potassium: 4.6 mmol/L (ref 3.5–5.2)
Sodium: 133 mmol/L — ABNORMAL LOW (ref 134–144)
Total Protein: 6.9 g/dL (ref 6.0–8.5)
eGFR: 97 mL/min/1.73

## 2024-05-15 ENCOUNTER — Encounter: Payer: Self-pay | Admitting: Family Medicine

## 2024-05-15 ENCOUNTER — Ambulatory Visit: Admitting: Family Medicine

## 2024-05-15 VITALS — BP 124/73 | HR 75 | Ht 66.0 in | Wt 134.0 lb

## 2024-05-15 DIAGNOSIS — I10 Essential (primary) hypertension: Secondary | ICD-10-CM

## 2024-05-15 DIAGNOSIS — E782 Mixed hyperlipidemia: Secondary | ICD-10-CM

## 2024-05-15 DIAGNOSIS — F5101 Primary insomnia: Secondary | ICD-10-CM

## 2024-05-15 DIAGNOSIS — R42 Dizziness and giddiness: Secondary | ICD-10-CM

## 2024-05-15 MED ORDER — DOXEPIN HCL 10 MG PO CAPS: 10.0000 mg | ORAL_CAPSULE | Freq: Every day | ORAL | 2 refills | Status: DC

## 2024-05-15 NOTE — Patient Instructions (Signed)
" °  VISIT SUMMARY: During your visit, we discussed your ongoing sleep disturbances and a recent episode of vertigo. We also reviewed your blood pressure and cholesterol levels.  YOUR PLAN: INSOMNIA: You have been experiencing difficulty falling asleep despite using Ambien . -We are switching your medication to doxepin 10 mg for sleep. -Taper off Ambien  by halving the dose, then taking half a pill every other day, and eventually discontinuing it. -If doxepin does not work, we will consider other medications like Lunesta.  BENIGN PAROXYSMAL POSITIONAL VERTIGO: You had an episode of vertigo a few weeks ago, and you still experience brief dizziness at night. -If your vertigo episodes persist or worsen, we may refer you to physical therapy for exercises. -Seek emergency care if vertigo is continuous or comes with other concerning symptoms.  HYPERTENSION: Your blood pressure is well-controlled at home, but office readings are elevated likely due to white coat hypertension. -We rechecked your blood pressure in the office. -Continue your current antihypertensive medication.  HYPERLIPIDEMIA: Your cholesterol levels are well-controlled with your current medication. -Continue taking pravastatin  as prescribed.   "

## 2024-05-15 NOTE — Progress Notes (Signed)
 "  Established Patient Office Visit  Subjective   Patient ID: Charlotte Blackwell, female    DOB: [birth year]  Age: 73 y.o. MRN: [id number]  Chief Complaint  Patient presents with   Insomnia     History of Present Illness   [name] is a 73 year old female who presents with sleep disturbances and recent vertigo episode.  She has been experiencing ongoing sleep disturbances despite previous management attempts. She takes Ambien , which initially helped her fall asleep within 20 minutes, but now she lies awake for a couple of hours even after taking it about half an hour before bed. She has tried adjusting her sleep schedule by getting up earlier and staying up later, but these changes have not improved her sleep. She is frustrated with her current sleep situation.  Approximately two to three weeks ago, she experienced an episode of vertigo. She woke up one Saturday morning with diplopia and dizziness, which persisted throughout the day. By the next day, her symptoms had improved, and by Monday, she felt okay. Since then, she occasionally experiences brief episodes of dizziness, particularly when lying down at night.  Her current medications include pravastatin  and Ambien  for sleep. She also mentions a history of taking Lexapro in the past.        The 10-year ASCVD risk score (Arnett DK, et al., 2019) is: 27.1%  Health Maintenance Due  Topic Date Due   Hepatitis C Screening  Never done   Pneumococcal Vaccine: 50+ Years (1 of 2 - PCV) Never done   DTaP/Tdap/Td (3 - Tdap) 09/28/2016   COVID-19 Vaccine (4 - 2025-26 season) 01/15/2024   Colonoscopy  01/20/2024   Medicare Annual Wellness (AWV)  04/17/2024      Objective:     BP 124/73   Pulse 75   Ht 5' 6 (1.676 m)   Wt 134 lb (60.8 kg)   SpO2 96%   BMI 21.63 kg/m    Physical Exam   Gen: alert, oriented Pulm: no respiratory distress Psych: pleasant affect       No results found for any visits on  05/15/24.      Assessment & Plan:   Primary insomnia Assessment & Plan: Chronic insomnia with difficulty initiating sleep despite Ambien  use. Doxepin  preferred for cost-effectiveness and potential efficacy. - Prescribed doxepin  10 mg for sleep. - Advised tapering off Ambien  by halving the dose, then taking half a pill every other day, and eventually discontinuing. - If doxepin  is ineffective, will consider alternative medications such as  orexin inhibitor. Consider switching to alternative z-drug as last option.    Mixed hyperlipidemia Assessment & Plan: Cholesterol levels well-controlled with current medication regimen. LDL at 75 mg/dL, within target range. - Continue current pravastatin  regimen.    Essential hypertension Assessment & Plan: Blood pressure well-controlled since medication adjustment. Home readings normal, office readings elevated due to likely white coat hypertension. - Rechecked blood pressure in the office. - Continue current antihypertensive regimen.   Vertigo Assessment & Plan: Intermittent episodes improving over time. Discussed differences between benign paroxysmal positional vertigo and other causes of vertigo. - If vertigo episodes persist or worsen, will consider referral to physical therapy for exercises. - Advised seeking emergency care if vertigo is continuous or accompanied by other concerning symptoms.   Other orders -     Doxepin  HCl; Take 1 capsule (10 mg total) by mouth at bedtime.  Dispense: 30 capsule; Refill: 2      No follow-ups on file.  Toribio MARLA Slain, MD  "

## 2024-05-18 DIAGNOSIS — R42 Dizziness and giddiness: Secondary | ICD-10-CM | POA: Insufficient documentation

## 2024-05-18 NOTE — Assessment & Plan Note (Signed)
 Intermittent episodes improving over time. Discussed differences between benign paroxysmal positional vertigo and other causes of vertigo. - If vertigo episodes persist or worsen, will consider referral to physical therapy for exercises. - Advised seeking emergency care if vertigo is continuous or accompanied by other concerning symptoms.

## 2024-05-18 NOTE — Assessment & Plan Note (Signed)
 Cholesterol levels well-controlled with current medication regimen. LDL at 75 mg/dL, within target range. - Continue current pravastatin  regimen.

## 2024-05-18 NOTE — Assessment & Plan Note (Signed)
 Chronic insomnia with difficulty initiating sleep despite Ambien  use. Doxepin  preferred for cost-effectiveness and potential efficacy. - Prescribed doxepin  10 mg for sleep. - Advised tapering off Ambien  by halving the dose, then taking half a pill every other day, and eventually discontinuing. - If doxepin  is ineffective, will consider alternative medications such as  orexin inhibitor. Consider switching to alternative z-drug as last option.

## 2024-05-18 NOTE — Assessment & Plan Note (Signed)
 Blood pressure well-controlled since medication adjustment. Home readings normal, office readings elevated due to likely white coat hypertension. - Rechecked blood pressure in the office. - Continue current antihypertensive regimen.

## 2024-05-23 ENCOUNTER — Ambulatory Visit
Admission: RE | Admit: 2024-05-23 | Discharge: 2024-05-23 | Disposition: A | Discharge status: Home / Self Care | Source: Ambulatory Visit | Attending: Family Medicine | Admitting: Family Medicine

## 2024-05-23 ENCOUNTER — Ambulatory Visit

## 2024-05-23 DIAGNOSIS — Z1231 Encounter for screening mammogram for malignant neoplasm of breast: Secondary | ICD-10-CM

## 2024-05-28 ENCOUNTER — Encounter: Payer: Self-pay | Admitting: Family Medicine

## 2024-05-30 ENCOUNTER — Other Ambulatory Visit: Payer: Self-pay | Admitting: Family Medicine

## 2024-05-30 DIAGNOSIS — R42 Dizziness and giddiness: Secondary | ICD-10-CM

## 2024-05-30 MED ORDER — SUVOREXANT 5 MG PO TABS: 1.0000 | ORAL_TABLET | Freq: Every day | ORAL | 2 refills | Status: DC

## 2024-06-11 ENCOUNTER — Encounter: Payer: Self-pay | Admitting: Family Medicine

## 2024-06-12 ENCOUNTER — Other Ambulatory Visit: Payer: Self-pay | Admitting: Family Medicine

## 2024-06-12 DIAGNOSIS — F5101 Primary insomnia: Secondary | ICD-10-CM

## 2024-06-12 MED ORDER — ZOLPIDEM TARTRATE 10 MG PO TABS: 10.0000 mg | ORAL_TABLET | Freq: Every evening | ORAL | 1 refills | Status: AC | PRN

## 2024-08-01 ENCOUNTER — Other Ambulatory Visit

## 2024-08-08 ENCOUNTER — Encounter: Admitting: Family Medicine

## 2024-08-13 ENCOUNTER — Encounter: Admitting: Family Medicine
